# Patient Record
Sex: Female | Born: 1950 | Race: White | Hispanic: No | Marital: Married | Smoking: Never smoker
Health system: Southern US, Community
[De-identification: ages and names within clinical notes are randomized; demographics above are authoritative.]

## PROBLEM LIST (undated history)

## (undated) HISTORY — PX: EXCISIONAL HEMORRHOIDECTOMY: SHX1541

---

## 1998-03-28 ENCOUNTER — Other Ambulatory Visit: Admission: RE | Admit: 1998-03-28 | Discharge: 1998-03-28 | Payer: Self-pay | Admitting: Obstetrics and Gynecology

## 1999-06-04 ENCOUNTER — Other Ambulatory Visit: Admission: RE | Admit: 1999-06-04 | Discharge: 1999-06-04 | Payer: Self-pay | Admitting: *Deleted

## 2000-06-29 ENCOUNTER — Other Ambulatory Visit: Admission: RE | Admit: 2000-06-29 | Discharge: 2000-06-29 | Payer: Self-pay | Admitting: Obstetrics and Gynecology

## 2001-06-30 ENCOUNTER — Other Ambulatory Visit: Admission: RE | Admit: 2001-06-30 | Discharge: 2001-06-30 | Payer: Self-pay | Admitting: Obstetrics and Gynecology

## 2001-09-19 ENCOUNTER — Ambulatory Visit (HOSPITAL_COMMUNITY): Admission: RE | Admit: 2001-09-19 | Discharge: 2001-09-19 | Payer: Self-pay | Admitting: *Deleted

## 2001-11-24 ENCOUNTER — Encounter: Admission: RE | Admit: 2001-11-24 | Discharge: 2001-11-24 | Payer: Self-pay | Admitting: Obstetrics and Gynecology

## 2001-11-24 ENCOUNTER — Encounter: Payer: Self-pay | Admitting: Obstetrics and Gynecology

## 2002-07-05 ENCOUNTER — Other Ambulatory Visit: Admission: RE | Admit: 2002-07-05 | Discharge: 2002-07-05 | Payer: Self-pay | Admitting: Obstetrics and Gynecology

## 2003-08-28 ENCOUNTER — Other Ambulatory Visit: Admission: RE | Admit: 2003-08-28 | Discharge: 2003-08-28 | Payer: Self-pay | Admitting: Obstetrics and Gynecology

## 2003-10-17 ENCOUNTER — Encounter: Admission: RE | Admit: 2003-10-17 | Discharge: 2003-10-17 | Payer: Self-pay | Admitting: Obstetrics and Gynecology

## 2004-09-11 ENCOUNTER — Other Ambulatory Visit: Admission: RE | Admit: 2004-09-11 | Discharge: 2004-09-11 | Payer: Self-pay | Admitting: Obstetrics and Gynecology

## 2005-08-06 ENCOUNTER — Encounter: Admission: RE | Admit: 2005-08-06 | Discharge: 2005-08-06 | Payer: Self-pay | Admitting: Obstetrics and Gynecology

## 2005-09-15 ENCOUNTER — Other Ambulatory Visit: Admission: RE | Admit: 2005-09-15 | Discharge: 2005-09-15 | Payer: Self-pay | Admitting: Obstetrics and Gynecology

## 2005-12-07 ENCOUNTER — Ambulatory Visit (HOSPITAL_COMMUNITY): Admission: RE | Admit: 2005-12-07 | Discharge: 2005-12-07 | Payer: Self-pay | Admitting: *Deleted

## 2005-12-07 ENCOUNTER — Encounter (INDEPENDENT_AMBULATORY_CARE_PROVIDER_SITE_OTHER): Payer: Self-pay | Admitting: Specialist

## 2006-10-06 ENCOUNTER — Other Ambulatory Visit: Admission: RE | Admit: 2006-10-06 | Discharge: 2006-10-06 | Payer: Self-pay | Admitting: Obstetrics and Gynecology

## 2007-06-08 ENCOUNTER — Encounter: Admission: RE | Admit: 2007-06-08 | Discharge: 2007-06-08 | Payer: Self-pay | Admitting: Obstetrics and Gynecology

## 2007-10-12 ENCOUNTER — Other Ambulatory Visit: Admission: RE | Admit: 2007-10-12 | Discharge: 2007-10-12 | Payer: Self-pay | Admitting: Obstetrics and Gynecology

## 2008-09-05 ENCOUNTER — Encounter: Admission: RE | Admit: 2008-09-05 | Discharge: 2008-09-05 | Payer: Self-pay | Admitting: Obstetrics and Gynecology

## 2008-10-17 ENCOUNTER — Other Ambulatory Visit: Admission: RE | Admit: 2008-10-17 | Discharge: 2008-10-17 | Payer: Self-pay | Admitting: Obstetrics and Gynecology

## 2009-10-15 ENCOUNTER — Encounter: Admission: RE | Admit: 2009-10-15 | Discharge: 2009-10-15 | Payer: Self-pay | Admitting: Obstetrics and Gynecology

## 2009-10-30 ENCOUNTER — Other Ambulatory Visit: Admission: RE | Admit: 2009-10-30 | Discharge: 2009-10-30 | Payer: Self-pay | Admitting: Obstetrics and Gynecology

## 2010-06-13 NOTE — Op Note (Signed)
Charlene Jones, Charlene Jones              ACCOUNT NO.:  [id number]   MEDICAL RECORD NO.:  [id number]          PATIENT TYPE:  AMB   LOCATION:  DAY                          FACILITY:  [facility]   PHYSICIAN:  Alfonse Ras, MD   DATE OF BIRTH:  Oct 20, 1950   DATE OF PROCEDURE:  12/07/2005  DATE OF DISCHARGE:                               OPERATIVE REPORT   PREOPERATIVE DIAGNOSIS:  Internal hemorrhoids, external hemorrhoids,  with bleeding.   POSTOPERATIVE DIAGNOSIS:  Internal hemorrhoids, external hemorrhoids,  with bleeding.   PROCEDURE:  Procedure for prolapsing hemorrhoids, rectopexy.   ANESTHESIA:  General.   DESCRIPTION:  The patient was taken to the operating room and placed in  supine position.  After adequate anesthesia was induced using  endotracheal tube, the patient was placed in a prone jack-knife  position.  Perianal and rectal prep was performed using Betadine and  saline.  Anal dilatation was accomplished to three fingerbreadths.  The  three hemorrhoidal bundles were injected using 0.5% Marcaine with  Wydase.  Internal and external sphincter muscles were injected with an  additional 30 mL of 0.5% Marcaine.  A 2-0 Prolene pursestring suture was  placed in the submucosa approximately 4.5-5 cm proximal to the dentate  line.  This was done circumferentially.  The stapler was then introduced  into the rectum and placed above the pursestring suture.  This was tied  down.  It was then closed and fired.  A nice round rim of hemorrhoidal  tissue was seen.  Of note, the vagina was checked and there was no  involvement of the rectovaginal septum.  The staple line was then  inspected.  There was no bleeding.  Gelfoam packing was placed.  The  patient tolerated the procedure well and went to PACU in good condition.      Alfonse Ras, MD  Electronically Signed     KRE/MEDQ  D:  12/07/2005  T:  12/07/2005  Job:  [id number]

## 2010-10-22 ENCOUNTER — Other Ambulatory Visit: Payer: Self-pay | Admitting: Family Medicine

## 2010-10-22 DIAGNOSIS — Z1231 Encounter for screening mammogram for malignant neoplasm of breast: Secondary | ICD-10-CM

## 2010-10-28 ENCOUNTER — Ambulatory Visit
Admission: RE | Admit: 2010-10-28 | Discharge: 2010-10-28 | Disposition: A | Discharge status: Home / Self Care | Payer: Commercial Indemnity | Source: Ambulatory Visit | Attending: Family Medicine | Admitting: Family Medicine

## 2010-10-28 DIAGNOSIS — Z1231 Encounter for screening mammogram for malignant neoplasm of breast: Secondary | ICD-10-CM

## 2010-10-28 IMAGING — MG MM DIGITAL SCREENING {BCG}
4 series · 4 of 4 positions shown · non-contrast
Comparison: none

DG SCREEN MAMMOGRAM BILATERAL
Bilateral CC and MLO view(s) were taken.
Technologist: ABDALLA AHMED, RT, RM

DIGITAL SCREENING MAMMOGRAM WITH CAD:
There are scattered fibroglandular densities.  Possible asymmetry is noted in the left breast.  
Spot compression views and possibly sonography are recommended for further evaluation.  In the 
right breast, no masses or malignant type calcifications are identified.  Compared with prior 
studies.
Images were processed with CAD.

[R CC]
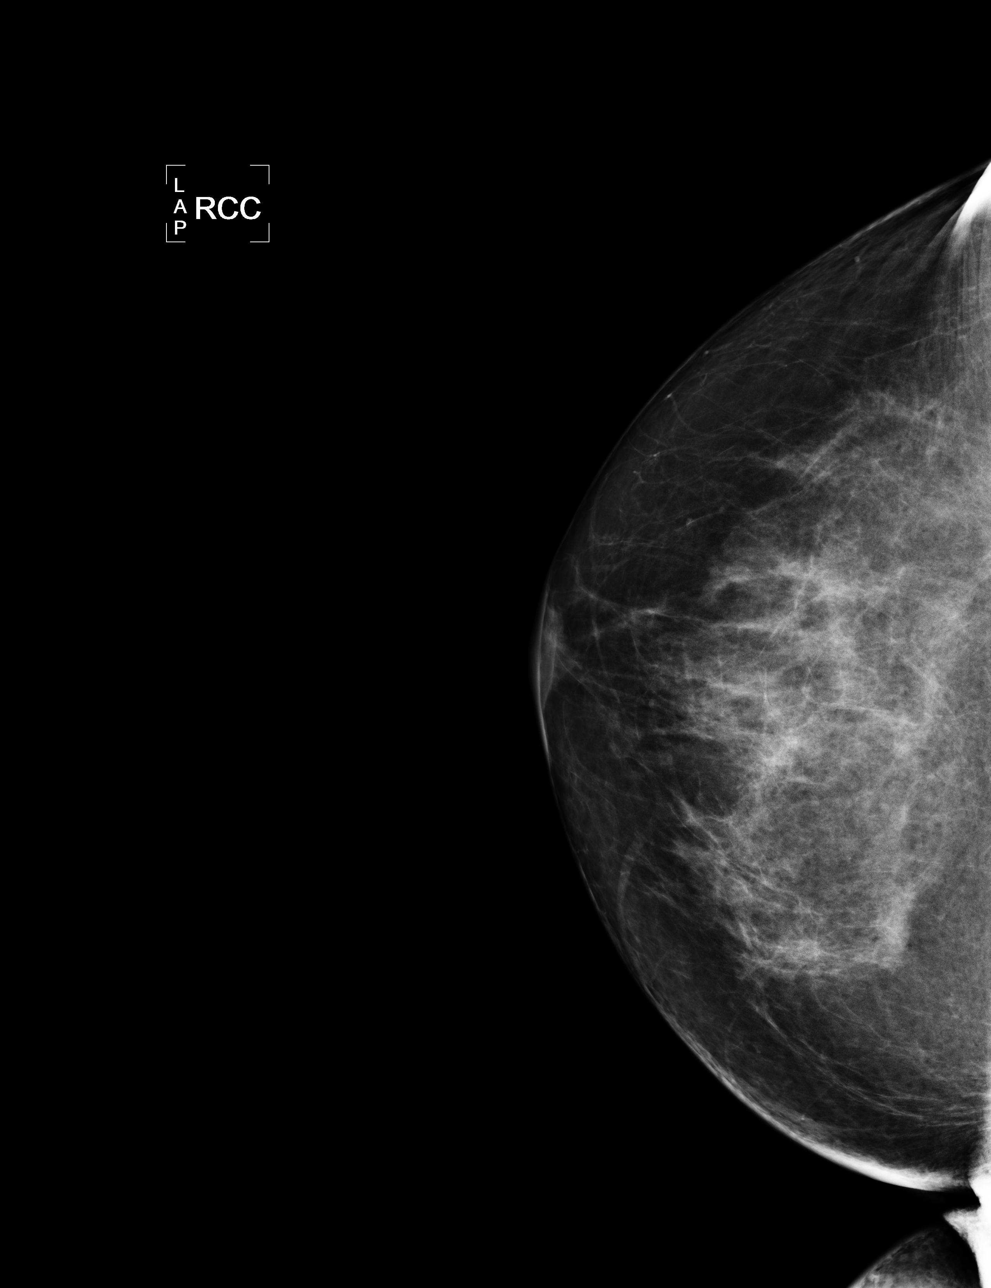

[L CC]
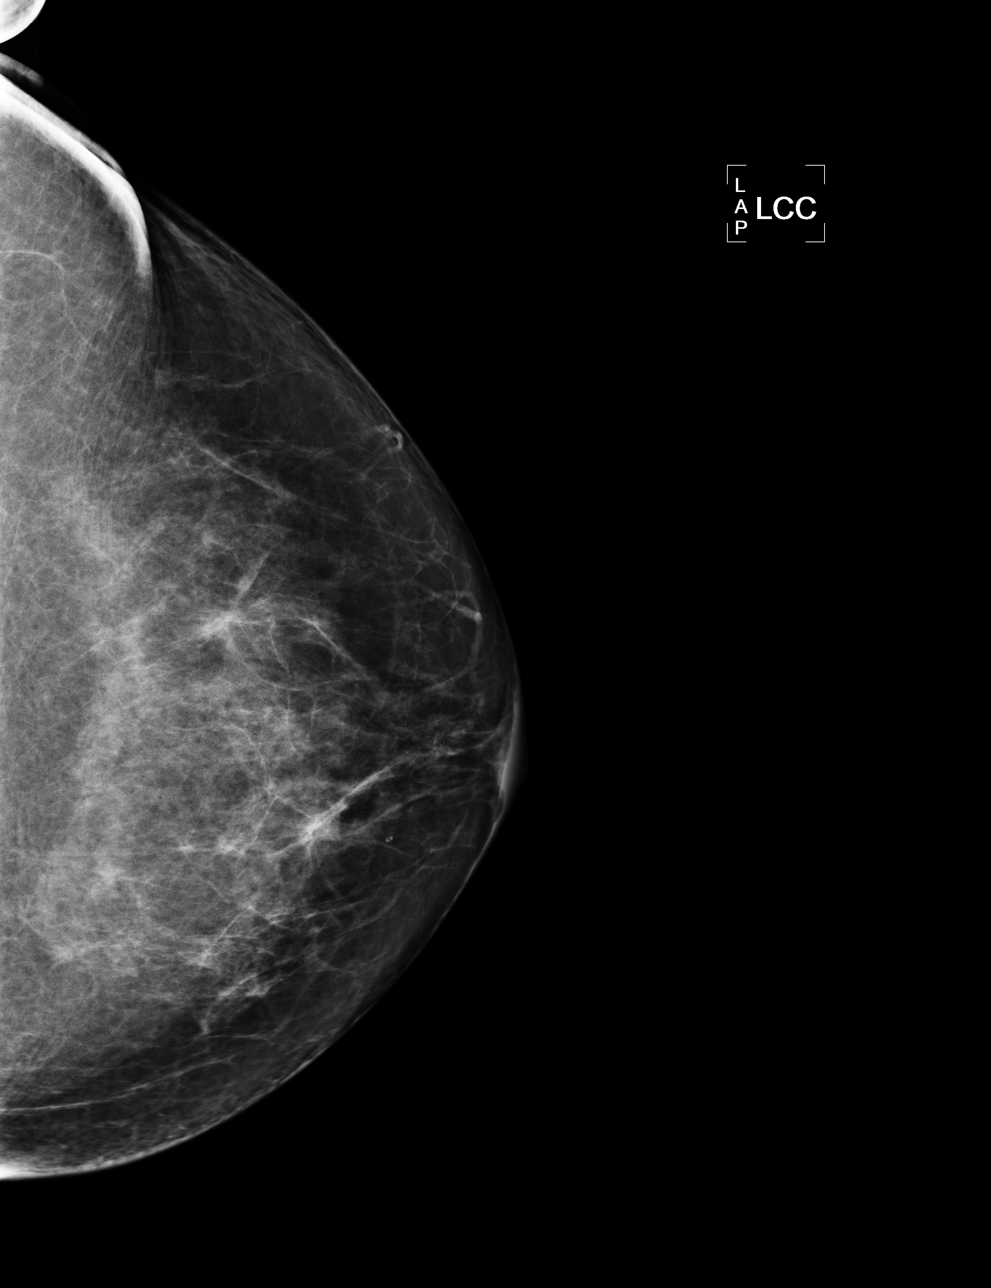

[L MLO]
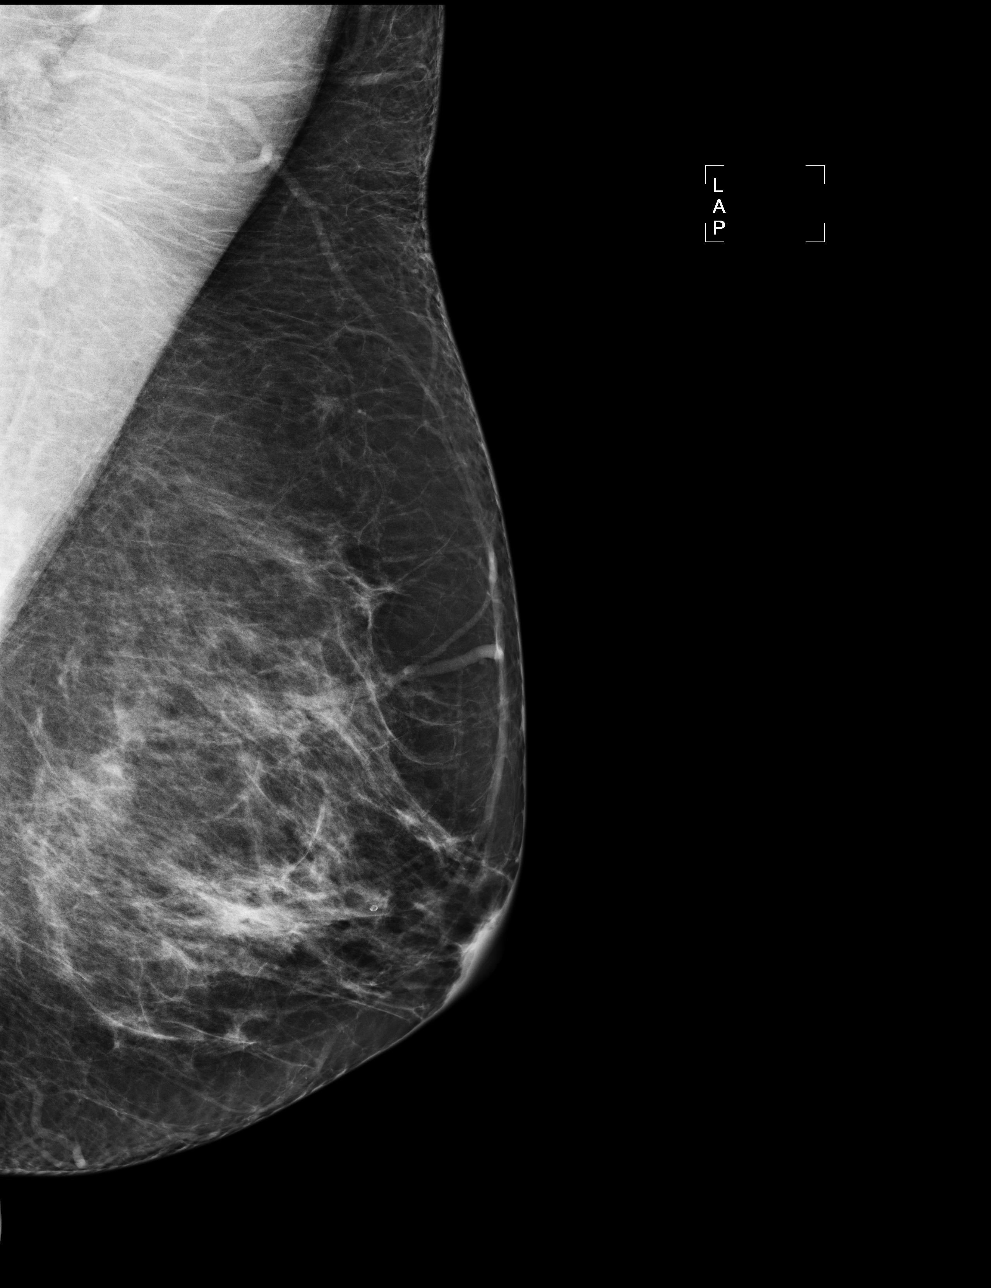

[R MLO]
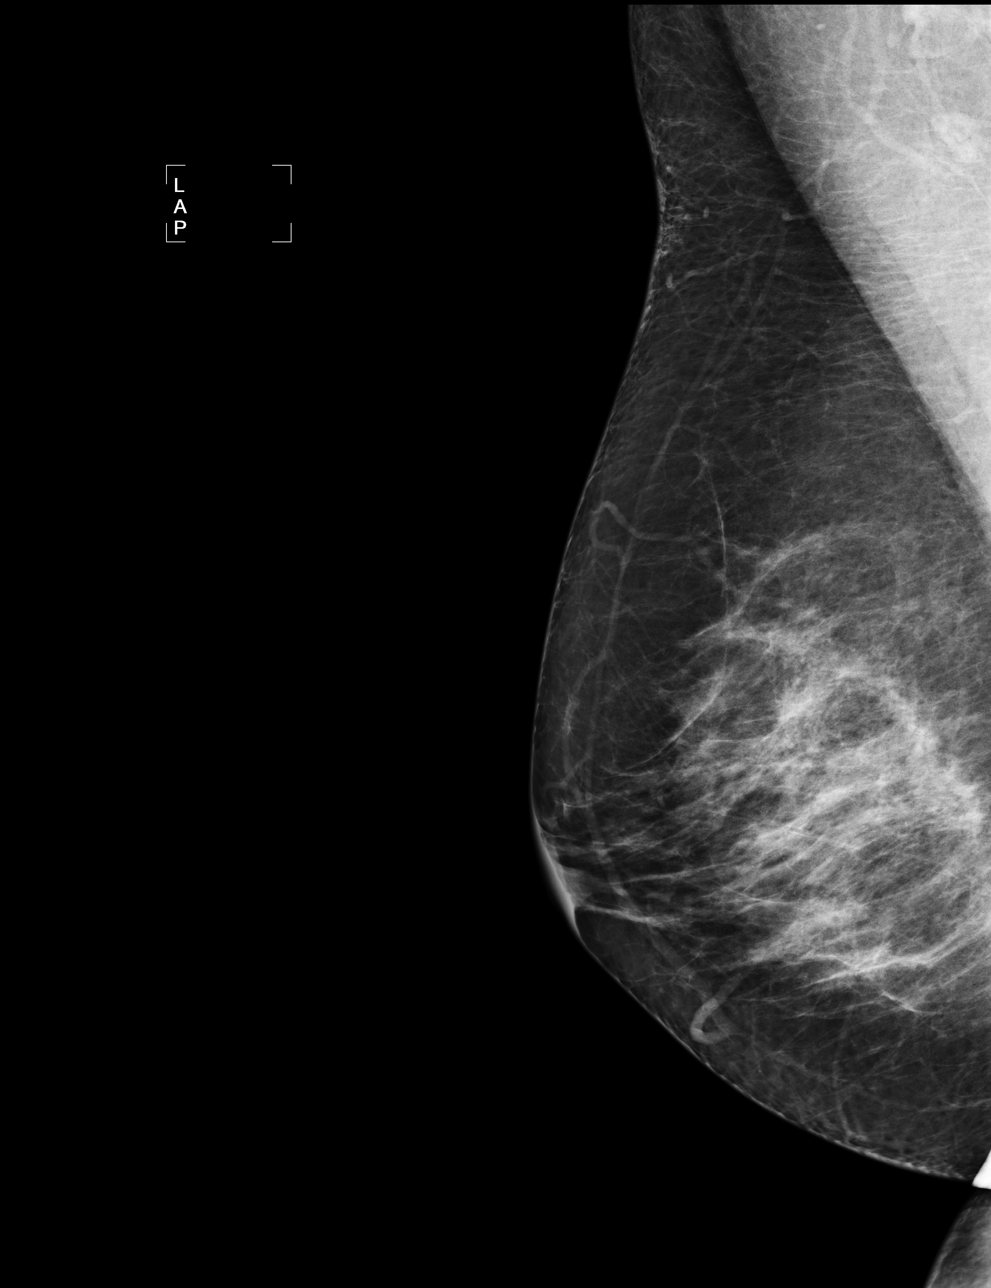

[4 of 4 positions shown; findings below may reference images not displayed]

IMPRESSION: Possible asymmetry, left breast.  Additional evaluation is indicated.  The patient will be 
contacted for additional studies and a supplementary report will follow.  No specific mammographic 
evidence of malignancy, right breast.

ASSESSMENT: Need additional imaging evaluation and/or prior mammograms for comparison - BI-RADS 0

Further imaging of the left breast.
,

## 2010-11-03 ENCOUNTER — Other Ambulatory Visit: Payer: Self-pay | Admitting: Family Medicine

## 2010-11-03 DIAGNOSIS — R928 Other abnormal and inconclusive findings on diagnostic imaging of breast: Secondary | ICD-10-CM

## 2010-11-14 ENCOUNTER — Ambulatory Visit
Admission: RE | Admit: 2010-11-14 | Discharge: 2010-11-14 | Disposition: A | Discharge status: Home / Self Care | Payer: Managed Care, Other (non HMO) | Source: Ambulatory Visit | Attending: Family Medicine | Admitting: Family Medicine

## 2010-11-14 DIAGNOSIS — R928 Other abnormal and inconclusive findings on diagnostic imaging of breast: Secondary | ICD-10-CM

## 2011-06-11 ENCOUNTER — Other Ambulatory Visit: Payer: Self-pay | Admitting: Family Medicine

## 2011-06-11 DIAGNOSIS — N6489 Other specified disorders of breast: Secondary | ICD-10-CM

## 2011-06-23 ENCOUNTER — Ambulatory Visit
Admission: RE | Admit: 2011-06-23 | Discharge: 2011-06-23 | Disposition: A | Discharge status: Home / Self Care | Payer: BC Managed Care – PPO | Source: Ambulatory Visit | Attending: Family Medicine | Admitting: Family Medicine

## 2011-06-23 DIAGNOSIS — N6489 Other specified disorders of breast: Secondary | ICD-10-CM

## 2011-12-15 ENCOUNTER — Other Ambulatory Visit: Payer: Self-pay | Admitting: Family Medicine

## 2011-12-15 DIAGNOSIS — Z1231 Encounter for screening mammogram for malignant neoplasm of breast: Secondary | ICD-10-CM

## 2012-01-21 ENCOUNTER — Ambulatory Visit
Admission: RE | Admit: 2012-01-21 | Discharge: 2012-01-21 | Disposition: A | Discharge status: Home / Self Care | Payer: BC Managed Care – PPO | Source: Ambulatory Visit | Attending: Family Medicine | Admitting: Family Medicine

## 2012-01-21 DIAGNOSIS — Z1231 Encounter for screening mammogram for malignant neoplasm of breast: Secondary | ICD-10-CM

## 2012-01-27 HISTORY — PX: OTHER SURGICAL HISTORY: SHX169

## 2012-11-30 ENCOUNTER — Other Ambulatory Visit: Payer: Self-pay | Admitting: Orthopedic Surgery

## 2012-11-30 DIAGNOSIS — M259 Joint disorder, unspecified: Secondary | ICD-10-CM

## 2012-12-02 ENCOUNTER — Other Ambulatory Visit: Payer: Self-pay

## 2012-12-08 ENCOUNTER — Other Ambulatory Visit: Payer: Self-pay | Admitting: Orthopedic Surgery

## 2012-12-10 ENCOUNTER — Ambulatory Visit
Admission: RE | Admit: 2012-12-10 | Discharge: 2012-12-10 | Disposition: A | Discharge status: Home / Self Care | Payer: BC Managed Care – PPO | Source: Ambulatory Visit | Attending: Orthopedic Surgery | Admitting: Orthopedic Surgery

## 2012-12-10 ENCOUNTER — Other Ambulatory Visit: Payer: Self-pay | Admitting: Orthopedic Surgery

## 2012-12-10 DIAGNOSIS — M259 Joint disorder, unspecified: Secondary | ICD-10-CM

## 2012-12-29 ENCOUNTER — Other Ambulatory Visit: Payer: Self-pay

## 2012-12-29 DIAGNOSIS — Z1231 Encounter for screening mammogram for malignant neoplasm of breast: Secondary | ICD-10-CM

## 2013-01-25 ENCOUNTER — Ambulatory Visit
Admission: RE | Admit: 2013-01-25 | Discharge: 2013-01-25 | Disposition: A | Discharge status: Home / Self Care | Payer: BC Managed Care – PPO | Source: Ambulatory Visit

## 2013-01-25 DIAGNOSIS — Z1231 Encounter for screening mammogram for malignant neoplasm of breast: Secondary | ICD-10-CM

## 2013-01-27 ENCOUNTER — Ambulatory Visit: Payer: BC Managed Care – PPO

## 2014-01-24 ENCOUNTER — Other Ambulatory Visit: Payer: Self-pay

## 2014-05-31 ENCOUNTER — Other Ambulatory Visit: Payer: Self-pay

## 2014-05-31 DIAGNOSIS — Z1231 Encounter for screening mammogram for malignant neoplasm of breast: Secondary | ICD-10-CM

## 2014-06-14 ENCOUNTER — Ambulatory Visit
Admission: RE | Admit: 2014-06-14 | Discharge: 2014-06-14 | Disposition: A | Discharge status: Home / Self Care | Payer: BLUE CROSS/BLUE SHIELD | Source: Ambulatory Visit

## 2014-06-14 DIAGNOSIS — Z1231 Encounter for screening mammogram for malignant neoplasm of breast: Secondary | ICD-10-CM

## 2015-06-19 ENCOUNTER — Other Ambulatory Visit: Payer: Self-pay

## 2015-06-19 DIAGNOSIS — Z1231 Encounter for screening mammogram for malignant neoplasm of breast: Secondary | ICD-10-CM

## 2015-07-11 ENCOUNTER — Ambulatory Visit
Admission: RE | Admit: 2015-07-11 | Discharge: 2015-07-11 | Disposition: A | Discharge status: Home / Self Care | Payer: BLUE CROSS/BLUE SHIELD | Source: Ambulatory Visit

## 2015-07-11 DIAGNOSIS — Z1231 Encounter for screening mammogram for malignant neoplasm of breast: Secondary | ICD-10-CM

## 2015-12-04 DIAGNOSIS — E782 Mixed hyperlipidemia: Secondary | ICD-10-CM

## 2015-12-04 DIAGNOSIS — R5383 Other fatigue: Secondary | ICD-10-CM | POA: Insufficient documentation

## 2015-12-04 DIAGNOSIS — R5381 Other malaise: Secondary | ICD-10-CM

## 2015-12-04 HISTORY — DX: Other malaise: R53.81

## 2015-12-04 HISTORY — DX: Other fatigue: R53.83

## 2015-12-04 HISTORY — DX: Mixed hyperlipidemia: E78.2

## 2016-10-02 ENCOUNTER — Other Ambulatory Visit: Payer: Self-pay | Admitting: Obstetrics and Gynecology

## 2016-10-02 DIAGNOSIS — Z1239 Encounter for other screening for malignant neoplasm of breast: Secondary | ICD-10-CM

## 2016-10-12 ENCOUNTER — Ambulatory Visit: Payer: BLUE CROSS/BLUE SHIELD

## 2016-10-14 ENCOUNTER — Ambulatory Visit
Admission: RE | Admit: 2016-10-14 | Discharge: 2016-10-14 | Disposition: A | Discharge status: Home / Self Care | Payer: BLUE CROSS/BLUE SHIELD | Source: Ambulatory Visit | Attending: Obstetrics and Gynecology | Admitting: Obstetrics and Gynecology

## 2016-10-14 DIAGNOSIS — Z1239 Encounter for other screening for malignant neoplasm of breast: Secondary | ICD-10-CM

## 2017-10-26 ENCOUNTER — Other Ambulatory Visit: Payer: Self-pay | Admitting: Obstetrics and Gynecology

## 2017-10-26 DIAGNOSIS — Z1231 Encounter for screening mammogram for malignant neoplasm of breast: Secondary | ICD-10-CM

## 2017-11-25 ENCOUNTER — Ambulatory Visit
Admission: RE | Admit: 2017-11-25 | Discharge: 2017-11-25 | Disposition: A | Discharge status: Home / Self Care | Payer: Medicare Other | Source: Ambulatory Visit | Attending: Obstetrics and Gynecology | Admitting: Obstetrics and Gynecology

## 2017-11-25 DIAGNOSIS — Z1231 Encounter for screening mammogram for malignant neoplasm of breast: Secondary | ICD-10-CM

## 2017-12-31 DIAGNOSIS — M8580 Other specified disorders of bone density and structure, unspecified site: Secondary | ICD-10-CM

## 2017-12-31 DIAGNOSIS — I10 Essential (primary) hypertension: Secondary | ICD-10-CM

## 2017-12-31 HISTORY — DX: Essential (primary) hypertension: I10

## 2017-12-31 HISTORY — DX: Other specified disorders of bone density and structure, unspecified site: M85.80

## 2018-12-28 ENCOUNTER — Other Ambulatory Visit: Payer: Self-pay | Admitting: Obstetrics and Gynecology

## 2018-12-28 DIAGNOSIS — Z1231 Encounter for screening mammogram for malignant neoplasm of breast: Secondary | ICD-10-CM

## 2019-01-06 ENCOUNTER — Ambulatory Visit: Payer: BLUE CROSS/BLUE SHIELD

## 2019-02-22 ENCOUNTER — Ambulatory Visit
Admission: RE | Admit: 2019-02-22 | Discharge: 2019-02-22 | Disposition: A | Discharge status: Home / Self Care | Payer: Medicare Other | Source: Ambulatory Visit | Attending: Obstetrics and Gynecology | Admitting: Obstetrics and Gynecology

## 2019-02-22 ENCOUNTER — Other Ambulatory Visit: Payer: Self-pay

## 2019-02-22 DIAGNOSIS — Z1231 Encounter for screening mammogram for malignant neoplasm of breast: Secondary | ICD-10-CM

## 2019-10-24 DIAGNOSIS — R200 Anesthesia of skin: Secondary | ICD-10-CM

## 2019-10-24 DIAGNOSIS — R208 Other disturbances of skin sensation: Secondary | ICD-10-CM | POA: Insufficient documentation

## 2019-10-24 HISTORY — DX: Other disturbances of skin sensation: R20.8

## 2019-10-24 HISTORY — DX: Anesthesia of skin: R20.0

## 2019-11-29 HISTORY — PX: HAND SURGERY: SHX662

## 2020-03-13 DIAGNOSIS — B370 Candidal stomatitis: Secondary | ICD-10-CM

## 2020-03-13 DIAGNOSIS — Z8616 Personal history of COVID-19: Secondary | ICD-10-CM

## 2020-03-13 HISTORY — DX: Personal history of COVID-19: Z86.16

## 2020-03-13 HISTORY — DX: Candidal stomatitis: B37.0

## 2020-06-14 ENCOUNTER — Other Ambulatory Visit: Payer: Self-pay | Admitting: Obstetrics and Gynecology

## 2020-06-14 DIAGNOSIS — Z1231 Encounter for screening mammogram for malignant neoplasm of breast: Secondary | ICD-10-CM

## 2020-06-24 DIAGNOSIS — R9431 Abnormal electrocardiogram [ECG] [EKG]: Secondary | ICD-10-CM | POA: Insufficient documentation

## 2020-06-24 HISTORY — DX: Abnormal electrocardiogram (ECG) (EKG): R94.31

## 2020-06-28 ENCOUNTER — Other Ambulatory Visit: Payer: Self-pay

## 2020-07-02 ENCOUNTER — Ambulatory Visit (INDEPENDENT_AMBULATORY_CARE_PROVIDER_SITE_OTHER): Payer: Medicare Other | Admitting: Cardiology

## 2020-07-02 ENCOUNTER — Encounter: Payer: Self-pay | Admitting: Cardiology

## 2020-07-02 ENCOUNTER — Other Ambulatory Visit: Payer: Self-pay

## 2020-07-02 VITALS — BP 138/82 | HR 81 | Ht 65.0 in | Wt 145.0 lb

## 2020-07-02 DIAGNOSIS — I10 Essential (primary) hypertension: Secondary | ICD-10-CM | POA: Diagnosis not present

## 2020-07-02 DIAGNOSIS — E782 Mixed hyperlipidemia: Secondary | ICD-10-CM | POA: Insufficient documentation

## 2020-07-02 DIAGNOSIS — R0602 Shortness of breath: Secondary | ICD-10-CM | POA: Insufficient documentation

## 2020-07-02 DIAGNOSIS — R9431 Abnormal electrocardiogram [ECG] [EKG]: Secondary | ICD-10-CM

## 2020-07-02 DIAGNOSIS — R600 Localized edema: Secondary | ICD-10-CM

## 2020-07-02 DIAGNOSIS — R079 Chest pain, unspecified: Secondary | ICD-10-CM | POA: Insufficient documentation

## 2020-07-02 HISTORY — DX: Chest pain, unspecified: R07.9

## 2020-07-02 HISTORY — DX: Mixed hyperlipidemia: E78.2

## 2020-07-02 HISTORY — DX: Localized edema: R60.0

## 2020-07-02 HISTORY — DX: Shortness of breath: R06.02

## 2020-07-02 NOTE — Patient Instructions (Signed)
Medication Instructions:  Your physician recommends that you continue on your current medications as directed. Please refer to the Current Medication list given to you today.  *If you need a refill on your cardiac medications before your next appointment, please call your pharmacy*   Lab Work: None If you have labs (blood work) drawn today and your tests are completely normal, you will receive your results only by: Marland Kitchen MyChart Message (if you have MyChart) OR . A paper copy in the mail If you have any lab test that is abnormal or we need to change your treatment, we will call you to review the results.   Testing/Procedures: Your physician has requested that you have an echocardiogram. Echocardiography is a painless test that uses sound waves to create images of your heart. It provides your doctor with information about the size and shape of your heart and how well your heart's chambers and valves are working. This procedure takes approximately one hour. There are no restrictions for this procedure.    [facility] [address] [place], [place] [address] Phone:  [phone]    Please arrive 15 minutes prior to your appointment time for registration and insurance purposes.  The test will take approximately 3 to 4 hours to complete; you may bring reading material.  If someone comes with you to your appointment, they will need to remain in the main lobby due to limited space in the testing area. **If you are pregnant or breastfeeding, please notify the nuclear lab prior to your appointment**  How to prepare for your Myocardial Perfusion Test: . Do not eat or drink 3 hours prior to your test, except you may have water. . Do not consume products containing caffeine (regular or decaffeinated) 12 hours prior to your test. (ex: coffee, chocolate, sodas, tea). . Do bring a list of your current medications with you.  If not listed below, you may take your medications  as normal. . Do wear comfortable clothes (no dresses or overalls) and walking shoes, tennis shoes preferred (No heels or open toe shoes are allowed). . Do NOT wear cologne, perfume, aftershave, or lotions (deodorant is allowed). . If these instructions are not followed, your test will have to be rescheduled.  Please report to [address] for your test.  If you have questions or concerns about your appointment, you can call the [facility] at [phone].  If you cannot keep your appointment, please provide 24 hours notification to the Nuclear Lab, to avoid a possible $50 charge to your account.    Follow-Up: At [facility], you and your health needs are our priority.  As part of our continuing mission to provide you with exceptional heart care, we have created designated Provider Care Teams.  These Care Teams include your primary Cardiologist (physician) and Advanced Practice Providers (APPs -  Physician Assistants and Nurse Practitioners) who all work together to provide you with the care you need, when you need it.  We recommend signing up for the patient portal called "MyChart".  Sign up information is provided on this After Visit Summary.  MyChart is used to connect with patients for Virtual Visits (Telemedicine).  Patients are able to view lab/test results, encounter notes, upcoming appointments, etc.  Non-urgent messages can be sent to your provider as well.   To learn more about what you can do with MyChart, go to [email or web address].    Your next appointment:   6 week(s)  The format for your next appointment:   In Person  Provider:   Berniece Salines, DO   Other Instructions

## 2020-07-02 NOTE — Progress Notes (Signed)
Cardiology Office Note:    Date:  07/02/2020   ID:  Charlene BaneDeborah P Pistole, DOB March 27, 1950, MRN [id number]  PCP:  Krystal ClarkBrown-Patram, Melissa Joyce, NP  Cardiologist:  Thomasene RippleKardie Judieth Mckown, DO  Electrophysiologist:  None   Referring MD: Rhea BleacherBrown-Patram, Melissa J*   I have been having some chest pain  History of Present Illness:    Charlene Jones is a 70 y.o. female with a hx of hyperlipidemia, elevated blood pressure here today to be evaluated for intermittent chest pain.  Patient tells me about 3 weeks ago she started experiencing midsternal chest discomfort.  Described as a dull sensation.  Symptoms are last for less than 5 minutes sometimes he goes up to a little more than 10 minutes.  Recently given this her PCP has started her on aspirin 81 mg daily.  She is concerned as the frequency is progressing and her chest pain is not resolving.  She does admit to associated shortness of breath.  Sometimes she says that shortness of breath is isolated.  No other complaints at this time.  Past Medical History:  Diagnosis Date  . Abnormal EKG 06/24/2020  . Essential hypertension 12/31/2017  . Hyperlipemia, mixed 12/04/2015  . Malaise and fatigue 12/04/2015  . Numbness of left foot 10/24/2019  . Oral thrush 03/13/2020  . Osteopenia 12/31/2017   Formatting of this note might be different from the original. FRAX 14% and 1.2%  11/2017  . Personal history of COVID-19 03/13/2020    Past Surgical History:  Procedure Laterality Date  . WRIST SURGREY  2014  . wrist surgrey  11/2019    Current Medications: Current Meds  Medication Sig  . Cholecalciferol 25 MCG (1000 UT) tablet Take 1,000 Units by mouth daily at 6 (six) AM.  . Multiple Vitamins-Minerals (MULTIVITAMIN WITH MINERALS) tablet Take 1 tablet by mouth daily.  . Omega-3 1000 MG CAPS Take 1 g by mouth daily at 6 (six) AM.  . Red Yeast Rice Extract (RED YEAST RICE PO) Take 1 tablet by mouth daily at 6 (six) AM.     Allergies:   Cephalexin and Codeine   Social  History   Socioeconomic History  . Marital status: Married    Spouse name: Not on file  . Number of children: Not on file  . Years of education: Not on file  . Highest education level: Not on file  Occupational History  . Not on file  Tobacco Use  . Smoking status: Never Smoker  . Smokeless tobacco: Never Used  Substance and Sexual Activity  . Alcohol use: Not Currently  . Drug use: Never  . Sexual activity: Not on file  Other Topics Concern  . Not on file  Social History Narrative  . Not on file   Social Determinants of Health   Financial Resource Strain: Not on file  Food Insecurity: Not on file  Transportation Needs: Not on file  Physical Activity: Not on file  Stress: Not on file  Social Connections: Not on file     Family History: The patient's family history includes Alzheimer's disease in her mother; Breast cancer in her paternal aunt; Colon cancer in her maternal grandmother; Dementia in her paternal grandmother; Heart attack (age of onset: 6156) in her father; Stomach cancer in her paternal grandfather; Stroke in her maternal grandfather.  ROS:   Review of Systems  Constitution: Negative for decreased appetite, fever and weight gain.  HENT: Negative for congestion, ear discharge, hoarse voice and sore throat.   Eyes: Negative  for discharge, redness, vision loss in right eye and visual halos.  Cardiovascular: Reports chest pain, dyspnea on exertion.  Negative for leg swelling, orthopnea and palpitations.  Respiratory: Negative for cough, hemoptysis, shortness of breath and snoring.   Endocrine: Negative for heat intolerance and polyphagia.  Hematologic/Lymphatic: Negative for bleeding problem. Does not bruise/bleed easily.  Skin: Negative for flushing, nail changes, rash and suspicious lesions.  Musculoskeletal: Negative for arthritis, joint pain, muscle cramps, myalgias, neck pain and stiffness.  Gastrointestinal: Negative for abdominal pain, bowel incontinence,  diarrhea and excessive appetite.  Genitourinary: Negative for decreased libido, genital sores and incomplete emptying.  Neurological: Negative for brief paralysis, focal weakness, headaches and loss of balance.  Psychiatric/Behavioral: Negative for altered mental status, depression and suicidal ideas.  Allergic/Immunologic: Negative for HIV exposure and persistent infections.    EKGs/Labs/Other Studies Reviewed:    The following studies were reviewed today:   EKG:  The ekg ordered today demonstrates sinus rhythm, heart rate 81 bpm with occasional premature atrial complexes.  P wave morphology suggesting left atrial enlargement and poor progression in the precordial leads suggesting possible old anterior wall infarction of age-indeterminate.  Recent Labs: No results found for requested labs within last 8760 hours.  Recent Lipid Panel No results found for: CHOL, TRIG, HDL, CHOLHDL, VLDL, LDLCALC, LDLDIRECT  Physical Exam:    VS:  BP 138/82 (BP Location: Right Arm)   Pulse 81   Ht 5\' 5"  (1.651 m)   Wt 145 lb (65.8 kg)   SpO2 98%   BMI 24.13 kg/m     Wt Readings from Last 3 Encounters:  07/02/20 145 lb (65.8 kg)     GEN: Well nourished, well developed in no acute distress HEENT: Normal NECK: No JVD; No carotid bruits LYMPHATICS: No lymphadenopathy CARDIAC: S1S2 noted,RRR, no murmurs, rubs, gallops RESPIRATORY:  Clear to auscultation without rales, wheezing or rhonchi  ABDOMEN: Soft, non-tender, non-distended, +bowel sounds, no guarding. EXTREMITIES: +2 bilateral leg edema edema, No cyanosis, no clubbing MUSCULOSKELETAL:  No deformity  SKIN: Warm and dry NEUROLOGIC:  Alert and oriented x 3, non-focal PSYCHIATRIC:  Normal affect, good insight  ASSESSMENT:    1. Essential hypertension   2. Shortness of breath   3. Chest pain of uncertain etiology   4. Bilateral leg edema   5. Mixed hyperlipidemia   6. Abnormal EKG    PLAN:    Her chest pain is concerning given her  risk factors we need to proceed with testing in this patient for ischemic evaluation.  She is agreeable to understand echo and exercise nuclear stress test.  Have educated patient about this testing.  In the meantime she has some shortness of breath and on physical exam there is evidence of bilateral leg edema.  Ideally we should really start her on low-dose diuretics to see if this is going to help with the patient really has declined any use of medication.  So for now I will get an echocardiogram to assess her LV function.  She is going to take her blood pressure at home and follow this she will come back in 4 weeks for nurse visit to make sure that her blood pressure is not elevated and her leg edema is not worsening.  Her most recent cholesterol profile showed LDL 185, total cholesterol 258, triglyceride 84 and HDL 65 for now she would like to hold off on any medication.   The patient is in agreement with the above plan. The patient left the office in  stable condition.  The patient will follow up in 3 months or sooner if needed.   Medication Adjustments/Labs and Tests Ordered: Current medicines are reviewed at length with the patient today.  Concerns regarding medicines are outlined above.  Orders Placed This Encounter  Procedures  . MYOCARDIAL PERFUSION IMAGING  . EKG 12-Lead  . ECHOCARDIOGRAM COMPLETE   No orders of the defined types were placed in this encounter.   Patient Instructions  Medication Instructions:  Your physician recommends that you continue on your current medications as directed. Please refer to the Current Medication list given to you today.  *If you need a refill on your cardiac medications before your next appointment, please call your pharmacy*   Lab Work: None If you have labs (blood work) drawn today and your tests are completely normal, you will receive your results only by: Marland Kitchen MyChart Message (if you have MyChart) OR . A paper copy in the mail If you  have any lab test that is abnormal or we need to change your treatment, we will call you to review the results.   Testing/Procedures: Your physician has requested that you have an echocardiogram. Echocardiography is a painless test that uses sound waves to create images of your heart. It provides your doctor with information about the size and shape of your heart and how well your heart's chambers and valves are working. This procedure takes approximately one hour. There are no restrictions for this procedure.    [facility] [address] [place], [place] [address] Phone:  [phone]    Please arrive 15 minutes prior to your appointment time for registration and insurance purposes.  The test will take approximately 3 to 4 hours to complete; you may bring reading material.  If someone comes with you to your appointment, they will need to remain in the main lobby due to limited space in the testing area. **If you are pregnant or breastfeeding, please notify the nuclear lab prior to your appointment**  How to prepare for your Myocardial Perfusion Test: . Do not eat or drink 3 hours prior to your test, except you may have water. . Do not consume products containing caffeine (regular or decaffeinated) 12 hours prior to your test. (ex: coffee, chocolate, sodas, tea). . Do bring a list of your current medications with you.  If not listed below, you may take your medications as normal. . Do wear comfortable clothes (no dresses or overalls) and walking shoes, tennis shoes preferred (No heels or open toe shoes are allowed). . Do NOT wear cologne, perfume, aftershave, or lotions (deodorant is allowed). . If these instructions are not followed, your test will have to be rescheduled.  Please report to [address] for your test.  If you have questions or concerns about your appointment, you can call the [facility] at  [phone].  If you cannot keep your appointment, please provide 24 hours notification to the Nuclear Lab, to avoid a possible $50 charge to your account.    Follow-Up: At [facility], you and your health needs are our priority.  As part of our continuing mission to provide you with exceptional heart care, we have created designated Provider Care Teams.  These Care Teams include your primary Cardiologist (physician) and Advanced Practice Providers (APPs -  Physician Assistants and Nurse Practitioners) who all work together to provide you with the care you need, when you need it.  We recommend signing up  for the patient portal called "MyChart".  Sign up information is provided on this After Visit Summary.  MyChart is used to connect with patients for Virtual Visits (Telemedicine).  Patients are able to view lab/test results, encounter notes, upcoming appointments, etc.  Non-urgent messages can be sent to your provider as well.   To learn more about what you can do with MyChart, go to [email or web address].    Your next appointment:   6 week(s)  The format for your next appointment:   In Person  Provider:   Thomasene Ripple, DO   Other Instructions      Adopting a Healthy Lifestyle.  Know what a healthy weight is for you (roughly BMI <25) and aim to maintain this   Aim for 7+ servings of fruits and vegetables daily   65-80+ fluid ounces of water or unsweet tea for healthy kidneys   Limit to max 1 drink of alcohol per day; avoid smoking/tobacco   Limit animal fats in diet for cholesterol and heart health - choose grass fed whenever available   Avoid highly processed foods, and foods high in saturated/trans fats   Aim for low stress - take time to unwind and care for your mental health   Aim for 150 min of moderate intensity exercise weekly for heart health, and weights twice weekly for bone health   Aim for 7-9 hours of sleep daily   When it comes to diets, agreement  about the perfect plan isnt easy to find, even among the experts. Experts at the [facility] [employer] developed an idea known as the Healthy Eating Plate. Just imagine a plate divided into logical, healthy portions.   The emphasis is on diet quality:   Load up on vegetables and fruits - one-half of your plate: Aim for color and variety, and remember that potatoes dont count.   Go for whole grains - one-quarter of your plate: Whole wheat, barley, wheat berries, quinoa, oats, brown rice, and foods made with them. If you want pasta, go with whole wheat pasta.   Protein power - one-quarter of your plate: Fish, chicken, beans, and nuts are all healthy, versatile protein sources. Limit red meat.   The diet, however, does go beyond the plate, offering a few other suggestions.   Use healthy plant oils, such as olive, canola, soy, corn, sunflower and peanut. Check the labels, and avoid partially hydrogenated oil, which have unhealthy trans fats.   If youre thirsty, drink water. Coffee and tea are good in moderation, but skip sugary drinks and limit milk and dairy products to one or two daily servings.   The type of carbohydrate in the diet is more important than the amount. Some sources of carbohydrates, such as vegetables, fruits, whole grains, and beans-are healthier than others.   Finally, stay active  Signed, Thomasene Ripple, DO  07/02/2020 5:25 PM    [facility]

## 2020-07-08 ENCOUNTER — Encounter: Payer: Self-pay | Admitting: Cardiology

## 2020-07-10 ENCOUNTER — Telehealth (HOSPITAL_COMMUNITY): Payer: Self-pay | Admitting: *Deleted

## 2020-07-10 NOTE — Telephone Encounter (Signed)
Left message on voicemail per DPR in reference to upcoming appointment scheduled on 07/17/20 at 8:15 with detailed instructions given per Myocardial Perfusion Study Information Sheet for the test. LM to arrive 15 minutes early, and that it is imperative to arrive on time for appointment to keep from having the test rescheduled. If you need to cancel or reschedule your appointment, please call the office within 24 hours of your appointment. Failure to do so may result in a cancellation of your appointment, and a $50 no show fee. Phone number given for call back for any questions.

## 2020-07-11 ENCOUNTER — Other Ambulatory Visit: Payer: Self-pay | Admitting: Cardiology

## 2020-07-11 ENCOUNTER — Other Ambulatory Visit (HOSPITAL_COMMUNITY): Payer: Self-pay | Admitting: Cardiology

## 2020-07-11 DIAGNOSIS — R0602 Shortness of breath: Secondary | ICD-10-CM

## 2020-07-11 DIAGNOSIS — R079 Chest pain, unspecified: Secondary | ICD-10-CM

## 2020-07-17 ENCOUNTER — Other Ambulatory Visit: Payer: Self-pay

## 2020-07-17 ENCOUNTER — Ambulatory Visit (INDEPENDENT_AMBULATORY_CARE_PROVIDER_SITE_OTHER): Payer: Medicare Other

## 2020-07-17 DIAGNOSIS — R079 Chest pain, unspecified: Secondary | ICD-10-CM

## 2020-07-17 DIAGNOSIS — R0602 Shortness of breath: Secondary | ICD-10-CM | POA: Diagnosis not present

## 2020-07-17 LAB — MYOCARDIAL PERFUSION IMAGING
Estimated workload: 10.1 METS
Exercise duration (min): 8 min
Exercise duration (sec): 16 s
LV dias vol: 66 mL (ref 46–106)
LV sys vol: 15 mL
MPHR: 153 {beats}/min
Peak HR: 142 {beats}/min
Percent HR: 92 %
Rest HR: 67 {beats}/min
SDS: 2
SRS: 0
SSS: 2
TID: 0.86

## 2020-07-17 MED ORDER — TECHNETIUM TC 99M TETROFOSMIN IV KIT
30.9000 | PACK | Freq: Once | INTRAVENOUS | Status: AC | PRN
  Administered 2020-07-17: 30.9 via INTRAVENOUS

## 2020-07-17 MED ORDER — TECHNETIUM TC 99M TETROFOSMIN IV KIT
10.4000 | PACK | Freq: Once | INTRAVENOUS | Status: AC | PRN
  Administered 2020-07-17: 10.4 via INTRAVENOUS

## 2020-07-23 ENCOUNTER — Other Ambulatory Visit: Payer: Self-pay

## 2020-07-23 ENCOUNTER — Encounter: Payer: Self-pay | Admitting: Cardiology

## 2020-07-23 ENCOUNTER — Ambulatory Visit (INDEPENDENT_AMBULATORY_CARE_PROVIDER_SITE_OTHER): Payer: Medicare Other | Admitting: Cardiology

## 2020-07-23 VITALS — BP 110/60 | HR 83 | Ht 65.0 in | Wt 143.8 lb

## 2020-07-23 DIAGNOSIS — R9439 Abnormal result of other cardiovascular function study: Secondary | ICD-10-CM

## 2020-07-23 DIAGNOSIS — I4729 Other ventricular tachycardia: Secondary | ICD-10-CM

## 2020-07-23 DIAGNOSIS — I472 Ventricular tachycardia: Secondary | ICD-10-CM | POA: Diagnosis not present

## 2020-07-23 DIAGNOSIS — I10 Essential (primary) hypertension: Secondary | ICD-10-CM | POA: Diagnosis not present

## 2020-07-23 DIAGNOSIS — E782 Mixed hyperlipidemia: Secondary | ICD-10-CM

## 2020-07-23 HISTORY — DX: Abnormal result of other cardiovascular function study: R94.39

## 2020-07-23 MED ORDER — ASPIRIN EC 81 MG PO TBEC: 81.0000 mg | DELAYED_RELEASE_TABLET | Freq: Every day | ORAL | 3 refills | Status: AC

## 2020-07-23 MED ORDER — ROSUVASTATIN CALCIUM 10 MG PO TABS: 10.0000 mg | ORAL_TABLET | Freq: Every day | ORAL | 3 refills | Status: DC

## 2020-07-23 NOTE — H&P (View-Only) (Signed)
Cardiology Office Note:    Date:  07/23/2020   ID:  Charlene Jones, DOB 08-Aug-1950, MRN [id number]  PCP:  Krystal Clark, NP  Cardiologist:  Thomasene Ripple, DO  Electrophysiologist:  None   Referring MD: Rhea Bleacher*   No chief complaint on file. " I am doing ok"  History of Present Illness:    Charlene Jones is a 70 y.o. female with a hx of hyperlipidemia, hypertension is here today for follow-up.  I saw the patient on July 02, 2020 at that time she was being evaluated for intermittent chest discomfort and shortness of breath.  At that time given her risk factors as well as her abnormal EKG I recommended the patient undergo a nuclear stress test.  She was able to get her exercise nuclear stress test and is here today to discuss result.  She also did have bilateral leg edema that day and an echocardiogram was ordered.  I recommended low-dose diuretics but the patient declined.  Past Medical History:  Diagnosis Date   Abnormal EKG 06/24/2020   Essential hypertension 12/31/2017   Hyperlipemia, mixed 12/04/2015   Malaise and fatigue 12/04/2015   Numbness of left foot 10/24/2019   Oral thrush 03/13/2020   Osteopenia 12/31/2017   Formatting of this note might be different from the original. FRAX 14% and 1.2%  11/2017   Personal history of COVID-19 03/13/2020    Past Surgical History:  Procedure Laterality Date   EXCISIONAL HEMORRHOIDECTOMY     with stapling   HAND SURGERY Left 11/29/2019   thumb, cmc joint resection orthroplasty, Dr. Loralie Champagne @ [facility]   WRIST SURGREY  2014    Current Medications: Current Meds  Medication Sig   aspirin EC 81 MG tablet Take 1 tablet (81 mg total) by mouth daily. Swallow whole.   Cholecalciferol 25 MCG (1000 UT) tablet Take 1,000 Units by mouth daily at 6 (six) AM.   Multiple Vitamins-Minerals (MULTIVITAMIN WITH MINERALS) tablet Take 1 tablet by mouth daily.   Omega-3 1000 MG CAPS Take 1 g by mouth daily at 6 (six) AM.   Red Yeast  Rice Extract (RED YEAST RICE PO) Take 1 tablet by mouth daily at 6 (six) AM.   rosuvastatin (CRESTOR) 10 MG tablet Take 1 tablet (10 mg total) by mouth daily.     Allergies:   Cephalexin and Codeine   Social History   Socioeconomic History   Marital status: Married    Spouse name: Not on file   Number of children: Not on file   Years of education: Not on file   Highest education level: Not on file  Occupational History   Not on file  Tobacco Use   Smoking status: Never   Smokeless tobacco: Never  Substance and Sexual Activity   Alcohol use: Not Currently   Drug use: Never   Sexual activity: Not on file  Other Topics Concern   Not on file  Social History Narrative   Not on file   Social Determinants of Health   Financial Resource Strain: Not on file  Food Insecurity: Not on file  Transportation Needs: Not on file  Physical Activity: Not on file  Stress: Not on file  Social Connections: Not on file     Family History: The patient's family history includes Alzheimer's disease in her mother; Breast cancer in her paternal aunt; CAD in her father; Chorea in her mother; Colon cancer in her maternal grandmother; Dementia in her paternal grandmother; Heart attack (  age of onset: 25) in her father; Stomach cancer in her paternal grandfather; Stroke in her maternal grandfather.  ROS:   Review of Systems  Constitution: Negative for decreased appetite, fever and weight gain.  HENT: Negative for congestion, ear discharge, hoarse voice and sore throat.   Eyes: Negative for discharge, redness, vision loss in right eye and visual halos.  Cardiovascular: Negative for chest pain, dyspnea on exertion, leg swelling, orthopnea and palpitations.  Respiratory: Negative for cough, hemoptysis, shortness of breath and snoring.   Endocrine: Negative for heat intolerance and polyphagia.  Hematologic/Lymphatic: Negative for bleeding problem. Does not bruise/bleed easily.  Skin: Negative for  flushing, nail changes, rash and suspicious lesions.  Musculoskeletal: Negative for arthritis, joint pain, muscle cramps, myalgias, neck pain and stiffness.  Gastrointestinal: Negative for abdominal pain, bowel incontinence, diarrhea and excessive appetite.  Genitourinary: Negative for decreased libido, genital sores and incomplete emptying.  Neurological: Negative for brief paralysis, focal weakness, headaches and loss of balance.  Psychiatric/Behavioral: Negative for altered mental status, depression and suicidal ideas.  Allergic/Immunologic: Negative for HIV exposure and persistent infections.    EKGs/Labs/Other Studies Reviewed:    The following studies were reviewed today:   EKG:  None today  Exercise  The left ventricular ejection fraction is hyperdynamic (>65%). Nuclear stress EF: 77%. There was no ST segment deviation noted during stress. No T wave inversion was noted during stress. 6 beat run of nonsustained ventricular tachycardia at peak exercise. Exercise duration 8 minutes 16 seconds, achieving 92% of maximal predicted heart rate, at 10.1 METS. Blood pressure response was normal with exercise. Defect 1: There is a small reversible defect of mild severity present in the basal inferior location. Findings consistent with ischemia. This is an intermediate risk study, due to ischemia as well as nonsustained ventricular tachycardia during peak exercise.    Recent Labs: No results found for requested labs within last 8760 hours.  Recent Lipid Panel No results found for: CHOL, TRIG, HDL, CHOLHDL, VLDL, LDLCALC, LDLDIRECT  Physical Exam:    VS:  BP 110/60   Pulse 83   Ht 5\' 5"  (1.651 m)   Wt 143 lb 12.8 oz (65.2 kg)   SpO2 97%   BMI 23.93 kg/m     Wt Readings from Last 3 Encounters:  07/23/20 143 lb 12.8 oz (65.2 kg)  07/17/20 145 lb (65.8 kg)  07/02/20 145 lb (65.8 kg)     GEN: Well nourished, well developed in no acute distress HEENT: Normal NECK: No JVD; No  carotid bruits LYMPHATICS: No lymphadenopathy CARDIAC: S1S2 noted,RRR, no murmurs, rubs, gallops RESPIRATORY:  Clear to auscultation without rales, wheezing or rhonchi  ABDOMEN: Soft, non-tender, non-distended, +bowel sounds, no guarding. EXTREMITIES: No edema, No cyanosis, no clubbing MUSCULOSKELETAL:  No deformity  SKIN: Warm and dry NEUROLOGIC:  Alert and oriented x 3, non-focal PSYCHIATRIC:  Normal affect, good insight  ASSESSMENT:    1. Abnormal nuclear stress test   2. Essential hypertension   3. Hyperlipemia, mixed   4. NSVT (nonsustained ventricular tachycardia) (HCC)    PLAN:    Her nuclear stress test is abnormal at this time I discussed with the patient given her risk factors to proceed with left heart catheterization.  The patient understands that risks include but are not limited to stroke (1 in 1000), death (1 in 1000), kidney failure [usually temporary] (1 in 500), bleeding (1 in 200), allergic reaction [possibly serious] (1 in 200), and agrees to proceed.  We will start the patient  on aspirin 81 mg daily as well as Crestor 5 mg daily.  She is very hesitant to start the statin medication.  But her last lipid profile showed LDL 185, total cholesterol 258, triglyceride 84 and HDL 65.  She would rather take red yeast rice.  But for now she is willing to try Crestor 5 mg daily for  Her blood pressure acceptable today.  The patient is in agreement with the above plan. The patient left the office in stable condition.  The patient will follow up in 4 weeks post heart catheterization.   Medication Adjustments/Labs and Tests Ordered: Current medicines are reviewed at length with the patient today.  Concerns regarding medicines are outlined above.  Orders Placed This Encounter  Procedures   Basic metabolic panel   Magnesium   CBC with Differential/Platelet    Meds ordered this encounter  Medications   rosuvastatin (CRESTOR) 10 MG tablet    Sig: Take 1 tablet (10 mg  total) by mouth daily.    Dispense:  90 tablet    Refill:  3   aspirin EC 81 MG tablet    Sig: Take 1 tablet (81 mg total) by mouth daily. Swallow whole.    Dispense:  90 tablet    Refill:  3     Patient Instructions  Medication Instructions:   Your physician has recommended you make the following change in your medication:  START: Aspirin 81 mg once daily START: Crestor 10 mg once daily  *If you need a refill on your cardiac medications before your next appointment, please call your pharmacy*   Lab Work: Your physician recommends that you return for lab work in:  TODAY: BMET, Mag, CBC  If you have labs (blood work) drawn today and your tests are completely normal, you will receive your results only by: MyChart Message (if you have MyChart) OR A paper copy in the mail If you have any lab test that is abnormal or we need to change your treatment, we will call you to review the results.   Testing/Procedures:    [facility] CARDIOVASCULAR DIVISION [facility] [address] [place] [address] Dept: [phone] Loc: [phone]  TOMEKO SCOVILLE  07/23/2020  You are scheduled for a Cardiac Catheterization on Friday, July 1 with Dr. Lance Muss.  1. Please arrive at the [facility] (Main Entrance A) at [facility]: [address] [place], [place] [address] at 5:30 AM  (This time is two hours before your procedure to ensure your preparation). Free valet parking service is available.   Special note: Every effort is made to have your procedure done on time. Please understand that emergencies sometimes delay scheduled procedures.  2. Diet: Do not eat solid foods after midnight.  The patient may have clear liquids until 5am upon the day of the procedure.  3. Labs: You will need to have blood drawn on TODAY.  4. Medication instructions in preparation for your procedure:   Contrast Allergy: No   On the morning  of your procedure, take your Aspirin and any morning medicines NOT listed above.  You may use sips of water.  5. Plan for one night stay--bring personal belongings. 6. Bring a current list of your medications and current insurance cards. 7. You MUST have a responsible person to drive you home. 8. Someone MUST be with you the first 24 hours after you arrive home or your discharge will be delayed. 9. Please wear  clothes that are easy to get on and off and wear slip-on shoes.  Thank you for allowing us to care for you!   -- [facility]    Follow-Up: At [facility], you and your health needs are our priority.  As part of our continuing mission to provide you with exceptional heart care, we have created designated Provider Care Teams.  These Care Teams include your primary Cardiologist (physician) and Advanced Practice Providers (APPs -  Physician Assistants and Nurse Practitioners) who all work together to provide you with the care you need, when you need it.  We recommend signing up for the patient portal called "MyChart".  Sign up information is provided on this After Visit Summary.  MyChart is used to connect with patients for Virtual Visits (Telemedicine).  Patients are able to view lab/test results, encounter notes, upcoming appointments, etc.  Non-urgent messages can be sent to your provider as well.   To learn more about what you can do with MyChart, go to [email or web address].    Your next appointment:   8 week(s)  The format for your next appointment:   In Person   Other Instructions    Adopting a Healthy Lifestyle.  Know what a healthy weight is for you (roughly BMI <25) and aim to maintain this   Aim for 7+ servings of fruits and vegetables daily   65-80+ fluid ounces of water or unsweet tea for healthy kidneys   Limit to max 1 drink of alcohol per day; avoid smoking/tobacco   Limit animal fats in diet for cholesterol and heart  health - choose grass fed whenever available   Avoid highly processed foods, and foods high in saturated/trans fats   Aim for low stress - take time to unwind and care for your mental health   Aim for 150 min of moderate intensity exercise weekly for heart health, and weights twice weekly for bone health   Aim for 7-9 hours of sleep daily   When it comes to diets, agreement about the perfect plan isnt easy to find, even among the experts. Experts at the [employer] developed an idea known as the Healthy Eating Plate. Just imagine a plate divided into logical, healthy portions.   The emphasis is on diet quality:   Load up on vegetables and fruits - one-half of your plate: Aim for color and variety, and remember that potatoes dont count.   Go for whole grains - one-quarter of your plate: Whole wheat, barley, wheat berries, quinoa, oats, brown rice, and foods made with them. If you want pasta, go with whole wheat pasta.   Protein power - one-quarter of your plate: Fish, chicken, beans, and nuts are all healthy, versatile protein sources. Limit red meat.   The diet, however, does go beyond the plate, offering a few other suggestions.   Use healthy plant oils, such as olive, canola, soy, corn, sunflower and peanut. Check the labels, and avoid partially hydrogenated oil, which have unhealthy trans fats.   If youre thirsty, drink water. Coffee and tea are good in moderation, but skip sugary drinks and limit milk and dairy products to one or two daily servings.   The type of carbohydrate in the diet is more important than the amount. Some sources of carbohydrates, such as vegetables, fruits, whole grains, and beans-are healthier than others.   Finally, stay active  Signed, Wesam Gearhart, DO  07/23/2020 5:12 PM    [facility]   HeartCare  

## 2020-07-23 NOTE — Progress Notes (Signed)
Cardiology Office Note:    Date:  07/23/2020   ID:  Charlene Jones, DOB 08-Aug-1950, MRN [id number]  PCP:  Krystal Clark, NP  Cardiologist:  Thomasene Ripple, DO  Electrophysiologist:  None   Referring MD: Rhea Bleacher*   No chief complaint on file. " I am doing ok"  History of Present Illness:    Charlene Jones is a 70 y.o. female with a hx of hyperlipidemia, hypertension is here today for follow-up.  I saw the patient on July 02, 2020 at that time she was being evaluated for intermittent chest discomfort and shortness of breath.  At that time given her risk factors as well as her abnormal EKG I recommended the patient undergo a nuclear stress test.  She was able to get her exercise nuclear stress test and is here today to discuss result.  She also did have bilateral leg edema that day and an echocardiogram was ordered.  I recommended low-dose diuretics but the patient declined.  Past Medical History:  Diagnosis Date   Abnormal EKG 06/24/2020   Essential hypertension 12/31/2017   Hyperlipemia, mixed 12/04/2015   Malaise and fatigue 12/04/2015   Numbness of left foot 10/24/2019   Oral thrush 03/13/2020   Osteopenia 12/31/2017   Formatting of this note might be different from the original. FRAX 14% and 1.2%  11/2017   Personal history of COVID-19 03/13/2020    Past Surgical History:  Procedure Laterality Date   EXCISIONAL HEMORRHOIDECTOMY     with stapling   HAND SURGERY Left 11/29/2019   thumb, cmc joint resection orthroplasty, Dr. Loralie Champagne @ [facility]   WRIST SURGREY  2014    Current Medications: Current Meds  Medication Sig   aspirin EC 81 MG tablet Take 1 tablet (81 mg total) by mouth daily. Swallow whole.   Cholecalciferol 25 MCG (1000 UT) tablet Take 1,000 Units by mouth daily at 6 (six) AM.   Multiple Vitamins-Minerals (MULTIVITAMIN WITH MINERALS) tablet Take 1 tablet by mouth daily.   Omega-3 1000 MG CAPS Take 1 g by mouth daily at 6 (six) AM.   Red Yeast  Rice Extract (RED YEAST RICE PO) Take 1 tablet by mouth daily at 6 (six) AM.   rosuvastatin (CRESTOR) 10 MG tablet Take 1 tablet (10 mg total) by mouth daily.     Allergies:   Cephalexin and Codeine   Social History   Socioeconomic History   Marital status: Married    Spouse name: Not on file   Number of children: Not on file   Years of education: Not on file   Highest education level: Not on file  Occupational History   Not on file  Tobacco Use   Smoking status: Never   Smokeless tobacco: Never  Substance and Sexual Activity   Alcohol use: Not Currently   Drug use: Never   Sexual activity: Not on file  Other Topics Concern   Not on file  Social History Narrative   Not on file   Social Determinants of Health   Financial Resource Strain: Not on file  Food Insecurity: Not on file  Transportation Needs: Not on file  Physical Activity: Not on file  Stress: Not on file  Social Connections: Not on file     Family History: The patient's family history includes Alzheimer's disease in her mother; Breast cancer in her paternal aunt; CAD in her father; Chorea in her mother; Colon cancer in her maternal grandmother; Dementia in her paternal grandmother; Heart attack (  age of onset: 25) in her father; Stomach cancer in her paternal grandfather; Stroke in her maternal grandfather.  ROS:   Review of Systems  Constitution: Negative for decreased appetite, fever and weight gain.  HENT: Negative for congestion, ear discharge, hoarse voice and sore throat.   Eyes: Negative for discharge, redness, vision loss in right eye and visual halos.  Cardiovascular: Negative for chest pain, dyspnea on exertion, leg swelling, orthopnea and palpitations.  Respiratory: Negative for cough, hemoptysis, shortness of breath and snoring.   Endocrine: Negative for heat intolerance and polyphagia.  Hematologic/Lymphatic: Negative for bleeding problem. Does not bruise/bleed easily.  Skin: Negative for  flushing, nail changes, rash and suspicious lesions.  Musculoskeletal: Negative for arthritis, joint pain, muscle cramps, myalgias, neck pain and stiffness.  Gastrointestinal: Negative for abdominal pain, bowel incontinence, diarrhea and excessive appetite.  Genitourinary: Negative for decreased libido, genital sores and incomplete emptying.  Neurological: Negative for brief paralysis, focal weakness, headaches and loss of balance.  Psychiatric/Behavioral: Negative for altered mental status, depression and suicidal ideas.  Allergic/Immunologic: Negative for HIV exposure and persistent infections.    EKGs/Labs/Other Studies Reviewed:    The following studies were reviewed today:   EKG:  None today  Exercise  The left ventricular ejection fraction is hyperdynamic (>65%). Nuclear stress EF: 77%. There was no ST segment deviation noted during stress. No T wave inversion was noted during stress. 6 beat run of nonsustained ventricular tachycardia at peak exercise. Exercise duration 8 minutes 16 seconds, achieving 92% of maximal predicted heart rate, at 10.1 METS. Blood pressure response was normal with exercise. Defect 1: There is a small reversible defect of mild severity present in the basal inferior location. Findings consistent with ischemia. This is an intermediate risk study, due to ischemia as well as nonsustained ventricular tachycardia during peak exercise.    Recent Labs: No results found for requested labs within last 8760 hours.  Recent Lipid Panel No results found for: CHOL, TRIG, HDL, CHOLHDL, VLDL, LDLCALC, LDLDIRECT  Physical Exam:    VS:  BP 110/60   Pulse 83   Ht 5\' 5"  (1.651 m)   Wt 143 lb 12.8 oz (65.2 kg)   SpO2 97%   BMI 23.93 kg/m     Wt Readings from Last 3 Encounters:  07/23/20 143 lb 12.8 oz (65.2 kg)  07/17/20 145 lb (65.8 kg)  07/02/20 145 lb (65.8 kg)     GEN: Well nourished, well developed in no acute distress HEENT: Normal NECK: No JVD; No  carotid bruits LYMPHATICS: No lymphadenopathy CARDIAC: S1S2 noted,RRR, no murmurs, rubs, gallops RESPIRATORY:  Clear to auscultation without rales, wheezing or rhonchi  ABDOMEN: Soft, non-tender, non-distended, +bowel sounds, no guarding. EXTREMITIES: No edema, No cyanosis, no clubbing MUSCULOSKELETAL:  No deformity  SKIN: Warm and dry NEUROLOGIC:  Alert and oriented x 3, non-focal PSYCHIATRIC:  Normal affect, good insight  ASSESSMENT:    1. Abnormal nuclear stress test   2. Essential hypertension   3. Hyperlipemia, mixed   4. NSVT (nonsustained ventricular tachycardia) (HCC)    PLAN:    Her nuclear stress test is abnormal at this time I discussed with the patient given her risk factors to proceed with left heart catheterization.  The patient understands that risks include but are not limited to stroke (1 in 1000), death (1 in 1000), kidney failure [usually temporary] (1 in 500), bleeding (1 in 200), allergic reaction [possibly serious] (1 in 200), and agrees to proceed.  We will start the patient  on aspirin 81 mg daily as well as Crestor 5 mg daily.  She is very hesitant to start the statin medication.  But her last lipid profile showed LDL 185, total cholesterol 258, triglyceride 84 and HDL 65.  She would rather take red yeast rice.  But for now she is willing to try Crestor 5 mg daily for  Her blood pressure acceptable today.  The patient is in agreement with the above plan. The patient left the office in stable condition.  The patient will follow up in 4 weeks post heart catheterization.   Medication Adjustments/Labs and Tests Ordered: Current medicines are reviewed at length with the patient today.  Concerns regarding medicines are outlined above.  Orders Placed This Encounter  Procedures   Basic metabolic panel   Magnesium   CBC with Differential/Platelet    Meds ordered this encounter  Medications   rosuvastatin (CRESTOR) 10 MG tablet    Sig: Take 1 tablet (10 mg  total) by mouth daily.    Dispense:  90 tablet    Refill:  3   aspirin EC 81 MG tablet    Sig: Take 1 tablet (81 mg total) by mouth daily. Swallow whole.    Dispense:  90 tablet    Refill:  3     Patient Instructions  Medication Instructions:   Your physician has recommended you make the following change in your medication:  START: Aspirin 81 mg once daily START: Crestor 10 mg once daily  *If you need a refill on your cardiac medications before your next appointment, please call your pharmacy*   Lab Work: Your physician recommends that you return for lab work in:  TODAY: BMET, Mag, CBC  If you have labs (blood work) drawn today and your tests are completely normal, you will receive your results only by: MyChart Message (if you have MyChart) OR A paper copy in the mail If you have any lab test that is abnormal or we need to change your treatment, we will call you to review the results.   Testing/Procedures:    [facility]OVASCULAR [facility] AT [place] [address] [place] [address] Dept: [phone] Loc: [phone]  Charlene Jones  07/23/2020  You are scheduled for a Cardiac Catheterization on Friday, July 1 with Dr. Lance Muss.  1. Please arrive at the [facility] (Main Entrance A) at [facility]: [address] [place], [place] [address] at 5:30 AM  (This time is two hours before your procedure to ensure your preparation). Free valet parking service is available.   Special note: Every effort is made to have your procedure done on time. Please understand that emergencies sometimes delay scheduled procedures.  2. Diet: Do not eat solid foods after midnight.  The patient may have clear liquids until 5am upon the day of the procedure.  3. Labs: You will need to have blood drawn on TODAY.  4. Medication instructions in preparation for your procedure:   Contrast Allergy: No   On the morning  of your procedure, take your Aspirin and any morning medicines NOT listed above.  You may use sips of water.  5. Plan for one night stay--bring personal belongings. 6. Bring a current list of your medications and current insurance cards. 7. You MUST have a responsible person to drive you home. 8. Someone MUST be with you the first 24 hours after you arrive home or your discharge will be delayed. 9. Please wear  clothes that are easy to get on and off and wear slip-on shoes.  Thank you for allowing us to care for you!   -- [place] [facility]    Follow-Up: At [facility], you and your health needs are our priority.  As part of our continuing mission to provide you with exceptional heart care, we have created designated Provider Care Teams.  These Care Teams include your primary Cardiologist (physician) and Advanced Practice Providers (APPs -  Physician Assistants and Nurse Practitioners) who all work together to provide you with the care you need, when you need it.  We recommend signing up for the patient portal called "MyChart".  Sign up information is provided on this After Visit Summary.  MyChart is used to connect with patients for Virtual Visits (Telemedicine).  Patients are able to view lab/test results, encounter notes, upcoming appointments, etc.  Non-urgent messages can be sent to your provider as well.   To learn more about what you can do with MyChart, go to [email or web address].    Your next appointment:   8 week(s)  The format for your next appointment:   In Person   Other Instructions    Adopting a Healthy Lifestyle.  Know what a healthy weight is for you (roughly BMI <25) and aim to maintain this   Aim for 7+ servings of fruits and vegetables daily   65-80+ fluid ounces of water or unsweet tea for healthy kidneys   Limit to max 1 drink of alcohol per day; avoid smoking/tobacco   Limit animal fats in diet for cholesterol and heart  health - choose grass fed whenever available   Avoid highly processed foods, and foods high in saturated/trans fats   Aim for low stress - take time to unwind and care for your mental health   Aim for 150 min of moderate intensity exercise weekly for heart health, and weights twice weekly for bone health   Aim for 7-9 hours of sleep daily   When it comes to diets, agreement about the perfect plan isnt easy to find, even among the experts. Experts at the [employer] developed an idea known as the Healthy Eating Plate. Just imagine a plate divided into logical, healthy portions.   The emphasis is on diet quality:   Load up on vegetables and fruits - one-half of your plate: Aim for color and variety, and remember that potatoes dont count.   Go for whole grains - one-quarter of your plate: Whole wheat, barley, wheat berries, quinoa, oats, brown rice, and foods made with them. If you want pasta, go with whole wheat pasta.   Protein power - one-quarter of your plate: Fish, chicken, beans, and nuts are all healthy, versatile protein sources. Limit red meat.   The diet, however, does go beyond the plate, offering a few other suggestions.   Use healthy plant oils, such as olive, canola, soy, corn, sunflower and peanut. Check the labels, and avoid partially hydrogenated oil, which have unhealthy trans fats.   If youre thirsty, drink water. Coffee and tea are good in moderation, but skip sugary drinks and limit milk and dairy products to one or two daily servings.   The type of carbohydrate in the diet is more important than the amount. Some sources of carbohydrates, such as vegetables, fruits, whole grains, and beans-are healthier than others.   Finally, stay active  Signed, Thomasene RippleKardie Giannis Corpuz, DO  07/23/2020 5:12 PM    [facility]  

## 2020-07-23 NOTE — Patient Instructions (Addendum)
Medication Instructions:   Your physician has recommended you make the following change in your medication:  START: Aspirin 81 mg once daily START: Crestor 10 mg once daily  *If you need a refill on your cardiac medications before your next appointment, please call your pharmacy*   Lab Work: Your physician recommends that you return for lab work in:  TODAY: BMET, Mag, CBC  If you have labs (blood work) drawn today and your tests are completely normal, you will receive your results only by: MyChart Message (if you have MyChart) OR A paper copy in the mail If you have any lab test that is abnormal or we need to change your treatment, we will call you to review the results.   Testing/Procedures:    [place] [facility] C[facility]IOVASCULAR DIVISION [facility] AT [place] [address] [place] [address] Dept: [phone] Loc: [phone]  Charlene Jones  07/23/2020  You are scheduled for a Cardiac Catheterization on Friday, July 1 with Dr. Lance Muss.  1. Please arrive at the [facility] (Main Entrance A) at [facility]: [address] [place], [place] [address] at 5:30 AM  (This time is two hours before your procedure to ensure your preparation). Free valet parking service is available.   Special note: Every effort is made to have your procedure done on time. Please understand that emergencies sometimes delay scheduled procedures.  2. Diet: Do not eat solid foods after midnight.  The patient may have clear liquids until 5am upon the day of the procedure.  3. Labs: You will need to have blood drawn on TODAY.  4. Medication instructions in preparation for your procedure:   Contrast Allergy: No   On the morning of your procedure, take your Aspirin and any morning medicines NOT listed above.  You may use sips of water.  5. Plan for one night stay--bring personal belongings. 6. Bring a current list of your medications and current  insurance cards. 7. You MUST have a responsible person to drive you home. 8. Someone MUST be with you the first 24 hours after you arrive home or your discharge will be delayed. 9. Please wear clothes that are easy to get on and off and wear slip-on shoes.  Thank you for allowing [place] to care for you!   -- [place] [facility]    Follow-Up: At [facility], you and your health needs are our priority.  As part of our continuing mission to provide you with exceptional heart care, we have created designated Provider Care Teams.  These Care Teams include your primary Cardiologist (physician) and Advanced Practice Providers (APPs -  Physician Assistants and Nurse Practitioners) who all work together to provide you with the care you need, when you need it.  We recommend signing up for the patient portal called "MyChart".  Sign up information is provided on this After Visit Summary.  MyChart is used to connect with patients for Virtual Visits (Telemedicine).  Patients are able to view lab/test results, encounter notes, upcoming appointments, etc.  Non-urgent messages can be sent to your provider as well.   To learn more about what you can do with MyChart, go to [email or web address].    Your next appointment:   8 week(s)  The format for your next appointment:   In Person   Other Instructions

## 2020-07-24 LAB — CBC WITH DIFFERENTIAL/PLATELET
Basophils Absolute: 0 10*3/uL (ref 0.0–0.2)
Basos: 1 %
EOS (ABSOLUTE): 0.1 10*3/uL (ref 0.0–0.4)
Eos: 2 %
Hematocrit: 47.6 % — ABNORMAL HIGH (ref 34.0–46.6)
Hemoglobin: 15.5 g/dL (ref 11.1–15.9)
Immature Grans (Abs): 0 10*3/uL (ref 0.0–0.1)
Immature Granulocytes: 0 %
Lymphocytes Absolute: 1.3 10*3/uL (ref 0.7–3.1)
Lymphs: 32 %
MCH: 30.4 pg (ref 26.6–33.0)
MCHC: 32.6 g/dL (ref 31.5–35.7)
MCV: 93 fL (ref 79–97)
Monocytes Absolute: 0.3 10*3/uL (ref 0.1–0.9)
Monocytes: 8 %
Neutrophils Absolute: 2.2 10*3/uL (ref 1.4–7.0)
Neutrophils: 57 %
Platelets: 247 10*3/uL (ref 150–450)
RBC: 5.1 x10E6/uL (ref 3.77–5.28)
RDW: 12.2 % (ref 11.7–15.4)
WBC: 4 10*3/uL (ref 3.4–10.8)

## 2020-07-24 LAB — BASIC METABOLIC PANEL
BUN/Creatinine Ratio: 24 (ref 12–28)
BUN: 14 mg/dL (ref 8–27)
CO2: 21 mmol/L (ref 20–29)
Calcium: 9.4 mg/dL (ref 8.7–10.3)
Chloride: 97 mmol/L (ref 96–106)
Creatinine, Ser: 0.58 mg/dL (ref 0.57–1.00)
Glucose: 91 mg/dL (ref 65–99)
Potassium: 4.2 mmol/L (ref 3.5–5.2)
Sodium: 139 mmol/L (ref 134–144)
eGFR: 99 mL/min/{1.73_m2} (ref >59)

## 2020-07-24 LAB — MAGNESIUM: Magnesium: 2.1 mg/dL (ref 1.6–2.3)

## 2020-07-25 ENCOUNTER — Telehealth: Payer: Self-pay | Admitting: *Deleted

## 2020-07-25 ENCOUNTER — Telehealth: Payer: Self-pay | Admitting: Cardiology

## 2020-07-25 NOTE — Telephone Encounter (Signed)
Charlene Jones addressed this.

## 2020-07-25 NOTE — Telephone Encounter (Signed)
Pt contacted pre-catheterization scheduled at [facility] for: Friday July 26, 2020 7:30 AM Verified arrival time and place: [facility] Main Entrance A [facility]) at: 5:30 AM   No solid food after midnight prior to cath, clear liquids until 5 AM day of procedure.   AM meds can be  taken pre-cath with sips of water including: aspirin 81 mg   Confirmed patient has responsible adult to drive home post procedure and be with patient first 24 hours after arriving home: yes  You are allowed ONE visitor in the waiting room during the time you are at the hospital for your procedure. Both you and your visitor must wear a mask once you enter the hospital.   Patient reports does not currently have any symptoms concerning for COVID-19 and no household members with COVID-19 like illness.   Reviewed procedure/mask/visitor instructions with patient.  Patient advised to avoid creams and lotions morning of cath.

## 2020-07-25 NOTE — Telephone Encounter (Signed)
New message:    Patient calling to see if she can make up and lotions when she go CATH tomorrow. Please advise.

## 2020-07-25 NOTE — Telephone Encounter (Signed)
Left message for patient to return call.

## 2020-07-25 NOTE — Telephone Encounter (Signed)
Follow up:   Patient returning a call back.  

## 2020-07-26 ENCOUNTER — Other Ambulatory Visit: Payer: BLUE CROSS/BLUE SHIELD

## 2020-07-26 ENCOUNTER — Ambulatory Visit (HOSPITAL_COMMUNITY)
Admission: RE | Admit: 2020-07-26 | Discharge: 2020-07-26 | Disposition: A | Discharge status: Home / Self Care | Payer: Medicare Other | Attending: Interventional Cardiology | Admitting: Interventional Cardiology

## 2020-07-26 ENCOUNTER — Encounter (HOSPITAL_COMMUNITY)
Admission: RE | Disposition: A | Discharge status: Home / Self Care | Payer: Self-pay | Source: Home / Self Care | Attending: Interventional Cardiology

## 2020-07-26 ENCOUNTER — Encounter (HOSPITAL_COMMUNITY): Payer: Self-pay | Admitting: Interventional Cardiology

## 2020-07-26 ENCOUNTER — Other Ambulatory Visit: Payer: Self-pay

## 2020-07-26 DIAGNOSIS — Z7982 Long term (current) use of aspirin: Secondary | ICD-10-CM | POA: Diagnosis not present

## 2020-07-26 DIAGNOSIS — Z8616 Personal history of COVID-19: Secondary | ICD-10-CM | POA: Diagnosis not present

## 2020-07-26 DIAGNOSIS — I2510 Atherosclerotic heart disease of native coronary artery without angina pectoris: Secondary | ICD-10-CM | POA: Diagnosis not present

## 2020-07-26 DIAGNOSIS — I472 Ventricular tachycardia: Secondary | ICD-10-CM | POA: Diagnosis not present

## 2020-07-26 DIAGNOSIS — Z79899 Other long term (current) drug therapy: Secondary | ICD-10-CM | POA: Insufficient documentation

## 2020-07-26 DIAGNOSIS — Z885 Allergy status to narcotic agent status: Secondary | ICD-10-CM | POA: Diagnosis not present

## 2020-07-26 DIAGNOSIS — E785 Hyperlipidemia, unspecified: Secondary | ICD-10-CM | POA: Insufficient documentation

## 2020-07-26 DIAGNOSIS — Z881 Allergy status to other antibiotic agents status: Secondary | ICD-10-CM | POA: Diagnosis not present

## 2020-07-26 DIAGNOSIS — R9439 Abnormal result of other cardiovascular function study: Secondary | ICD-10-CM | POA: Diagnosis present

## 2020-07-26 DIAGNOSIS — I10 Essential (primary) hypertension: Secondary | ICD-10-CM | POA: Insufficient documentation

## 2020-07-26 DIAGNOSIS — R600 Localized edema: Secondary | ICD-10-CM | POA: Diagnosis not present

## 2020-07-26 HISTORY — PX: LEFT HEART CATH AND CORONARY ANGIOGRAPHY: CATH118249

## 2020-07-26 SURGERY — LEFT HEART CATH AND CORONARY ANGIOGRAPHY
Anesthesia: LOCAL

## 2020-07-26 MED ORDER — HEPARIN (PORCINE) IN NACL 1000-0.9 UT/500ML-% IV SOLN
INTRAVENOUS | Status: DC | PRN
  Administered 2020-07-26 (×2): 500 mL

## 2020-07-26 MED ORDER — SODIUM CHLORIDE 0.9% FLUSH: 3.0000 mL | INTRAVENOUS | Status: DC | PRN

## 2020-07-26 MED ORDER — SODIUM CHLORIDE 0.9 % IV SOLN: 250.0000 mL | INTRAVENOUS | Status: DC | PRN

## 2020-07-26 MED ORDER — SODIUM CHLORIDE 0.9% FLUSH: 3.0000 mL | Freq: Two times a day (BID) | INTRAVENOUS | Status: DC

## 2020-07-26 MED ORDER — FENTANYL CITRATE (PF) 100 MCG/2ML IJ SOLN
INTRAMUSCULAR | Status: AC
  Filled 2020-07-26: qty 2

## 2020-07-26 MED ORDER — IOHEXOL 350 MG/ML SOLN
INTRAVENOUS | Status: DC | PRN
  Administered 2020-07-26: 55 mL

## 2020-07-26 MED ORDER — SODIUM CHLORIDE 0.9 % WEIGHT BASED INFUSION
3.0000 mL/kg/h | INTRAVENOUS | Status: AC
  Administered 2020-07-26: 3 mL/kg/h via INTRAVENOUS

## 2020-07-26 MED ORDER — HEPARIN SODIUM (PORCINE) 1000 UNIT/ML IJ SOLN
INTRAMUSCULAR | Status: AC
  Filled 2020-07-26: qty 1

## 2020-07-26 MED ORDER — ONDANSETRON HCL 4 MG/2ML IJ SOLN: 4.0000 mg | Freq: Four times a day (QID) | INTRAMUSCULAR | Status: DC | PRN

## 2020-07-26 MED ORDER — HEPARIN (PORCINE) IN NACL 1000-0.9 UT/500ML-% IV SOLN
INTRAVENOUS | Status: AC
  Filled 2020-07-26: qty 500

## 2020-07-26 MED ORDER — LIDOCAINE HCL (PF) 1 % IJ SOLN
INTRAMUSCULAR | Status: AC
  Filled 2020-07-26: qty 30

## 2020-07-26 MED ORDER — MIDAZOLAM HCL 2 MG/2ML IJ SOLN
INTRAMUSCULAR | Status: AC
  Filled 2020-07-26: qty 2

## 2020-07-26 MED ORDER — HEPARIN SODIUM (PORCINE) 1000 UNIT/ML IJ SOLN
INTRAMUSCULAR | Status: DC | PRN
  Administered 2020-07-26: 3000 [IU] via INTRAVENOUS

## 2020-07-26 MED ORDER — MIDAZOLAM HCL 2 MG/2ML IJ SOLN
INTRAMUSCULAR | Status: DC | PRN
  Administered 2020-07-26: 2 mg via INTRAVENOUS
  Administered 2020-07-26: 1 mg via INTRAVENOUS

## 2020-07-26 MED ORDER — VERAPAMIL HCL 2.5 MG/ML IV SOLN
INTRAVENOUS | Status: AC
  Filled 2020-07-26: qty 2

## 2020-07-26 MED ORDER — SODIUM CHLORIDE 0.9 % WEIGHT BASED INFUSION: 1.0000 mL/kg/h | INTRAVENOUS | Status: DC

## 2020-07-26 MED ORDER — SODIUM CHLORIDE 0.9 % IV SOLN: INTRAVENOUS | Status: AC

## 2020-07-26 MED ORDER — HYDRALAZINE HCL 20 MG/ML IJ SOLN: 10.0000 mg | INTRAMUSCULAR | Status: DC | PRN

## 2020-07-26 MED ORDER — FENTANYL CITRATE (PF) 100 MCG/2ML IJ SOLN
INTRAMUSCULAR | Status: DC | PRN
  Administered 2020-07-26 (×2): 25 ug via INTRAVENOUS

## 2020-07-26 MED ORDER — LABETALOL HCL 5 MG/ML IV SOLN: 10.0000 mg | INTRAVENOUS | Status: DC | PRN

## 2020-07-26 MED ORDER — HEPARIN (PORCINE) IN NACL 2-0.9 UNITS/ML
INTRAMUSCULAR | Status: DC | PRN
  Administered 2020-07-26 (×2): 10 mL via INTRA_ARTERIAL

## 2020-07-26 MED ORDER — ACETAMINOPHEN 325 MG PO TABS: 650.0000 mg | ORAL_TABLET | ORAL | Status: DC | PRN

## 2020-07-26 MED ORDER — LIDOCAINE HCL (PF) 1 % IJ SOLN
INTRAMUSCULAR | Status: DC | PRN
  Administered 2020-07-26: 2 mL

## 2020-07-26 SURGICAL SUPPLY — 8 items
CATH 5FR JL3.5 JR4 ANG PIG MP (CATHETERS) ×2 IMPLANT
DEVICE RAD TR BAND REGULAR (VASCULAR PRODUCTS) ×2 IMPLANT
GLIDESHEATH SLEND SS 6F .021 (SHEATH) ×2 IMPLANT
KIT HEART LEFT (KITS) ×2 IMPLANT
PACK CARDIAC CATHETERIZATION (CUSTOM PROCEDURE TRAY) ×2 IMPLANT
TRANSDUCER W/STOPCOCK (MISCELLANEOUS) ×2 IMPLANT
TUBING CIL FLEX 10 FLL-RA (TUBING) ×2 IMPLANT
WIRE HI TORQ VERSACORE J 260CM (WIRE) ×2 IMPLANT

## 2020-07-26 NOTE — Interval H&P Note (Signed)
Cath Lab Visit (complete for each Cath Lab visit)  Clinical Evaluation Leading to the Procedure:   ACS: No.  Non-ACS:    Anginal Classification: CCS III  Anti-ischemic medical therapy: Minimal Therapy (1 class of medications)  Non-Invasive Test Results: Intermediate-risk stress test findings: cardiac mortality 1-3%/year  Prior CABG: No previous CABG      History and Physical Interval Note:  07/26/2020 7:38 AM  Charlene Jones  has presented today for surgery, with the diagnosis of abnormal stress test.  The various methods of treatment have been discussed with the patient and family. After consideration of risks, benefits and other options for treatment, the patient has consented to  Procedure(s): LEFT HEART CATH AND CORONARY ANGIOGRAPHY (N/A) as a surgical intervention.  The patient's history has been reviewed, patient examined, no change in status, stable for surgery.  I have reviewed the patient's chart and labs.  Questions were answered to the patient's satisfaction.     Lance Muss

## 2020-07-26 NOTE — Research (Signed)
IDENTIFY CADFEM Informed Consent   Subject Name: Charlene Jones  Subject met inclusion and exclusion criteria.  The informed consent form, study requirements and expectations were reviewed with the subject and questions and concerns were addressed prior to the signing of the consent form.  The subject verbalized understanding of the trail requirements.  The subject agreed to participate in the INDENTIFY CADFEM trial and signed the informed consent.  The informed consent was obtained prior to performance of any protocol-specific procedures for the subject.  A copy of the signed informed consent was given to the subject and a copy was placed in the subject's medical record.  [name] 07/26/2020, 9:23 AM

## 2020-08-02 ENCOUNTER — Telehealth: Payer: Self-pay | Admitting: Cardiology

## 2020-08-02 NOTE — Telephone Encounter (Signed)
Follow up:    Patient returning a call back for results 

## 2020-08-02 NOTE — Telephone Encounter (Signed)
    Pt c/o medication issue:  1. Name of Medication: cholesterol med  2. How are you currently taking this medication (dosage and times per day)?   3. Are you having a reaction (difficulty breathing--STAT)?   4. What is your medication issue? Pt would like to know if she can stop taking her cholesterol medication

## 2020-08-02 NOTE — Telephone Encounter (Signed)
Left message for patient to return our call.

## 2020-08-02 NOTE — Telephone Encounter (Signed)
Encounter not needed

## 2020-08-06 ENCOUNTER — Other Ambulatory Visit: Payer: Self-pay

## 2020-08-06 ENCOUNTER — Encounter: Payer: Self-pay | Admitting: Cardiology

## 2020-08-06 ENCOUNTER — Ambulatory Visit (INDEPENDENT_AMBULATORY_CARE_PROVIDER_SITE_OTHER): Payer: Medicare Other | Admitting: Cardiology

## 2020-08-06 ENCOUNTER — Ambulatory Visit: Payer: BLUE CROSS/BLUE SHIELD | Admitting: Cardiology

## 2020-08-06 VITALS — BP 159/102 | HR 66 | Ht 65.0 in | Wt 146.0 lb

## 2020-08-06 DIAGNOSIS — I10 Essential (primary) hypertension: Secondary | ICD-10-CM

## 2020-08-06 DIAGNOSIS — I2510 Atherosclerotic heart disease of native coronary artery without angina pectoris: Secondary | ICD-10-CM | POA: Diagnosis not present

## 2020-08-06 DIAGNOSIS — I4729 Other ventricular tachycardia: Secondary | ICD-10-CM

## 2020-08-06 DIAGNOSIS — I472 Ventricular tachycardia: Secondary | ICD-10-CM | POA: Diagnosis not present

## 2020-08-06 DIAGNOSIS — E782 Mixed hyperlipidemia: Secondary | ICD-10-CM

## 2020-08-06 MED ORDER — NITROGLYCERIN 0.4 MG SL SUBL: 0.4000 mg | SUBLINGUAL_TABLET | SUBLINGUAL | 3 refills | Status: AC | PRN

## 2020-08-06 MED ORDER — AMLODIPINE BESYLATE 5 MG PO TABS: 5.0000 mg | ORAL_TABLET | Freq: Every day | ORAL | 3 refills | Status: DC

## 2020-08-06 MED ORDER — ROSUVASTATIN CALCIUM 10 MG PO TABS: 10.0000 mg | ORAL_TABLET | Freq: Every day | ORAL | 3 refills | Status: DC

## 2020-08-06 NOTE — Patient Instructions (Addendum)
Medication Instructions:  Your physician has recommended you make the following change in your medication:  START: Nitroglycerin 0.4 mg take one tablet by mouth every 5 minutes up to three times as needed for chest pain. START: Amlodipine 5 mg one daily   *If you need a refill on your cardiac medications before your next appointment, please call your pharmacy*   Lab Work: None If you have labs (blood work) drawn today and your tests are completely normal, you will receive your results only by: MyChart Message (if you have MyChart) OR A paper copy in the mail If you have any lab test that is abnormal or we need to change your treatment, we will call you to review the results.   Testing/Procedures: None   Follow-Up: At [facility], you and your health needs are our priority.  As part of our continuing mission to provide you with exceptional heart care, we have created designated Provider Care Teams.  These Care Teams include your primary Cardiologist (physician) and Advanced Practice Providers (APPs -  Physician Assistants and Nurse Practitioners) who all work together to provide you with the care you need, when you need it.  We recommend signing up for the patient portal called "MyChart".  Sign up information is provided on this After Visit Summary.  MyChart is used to connect with patients for Virtual Visits (Telemedicine).  Patients are able to view lab/test results, encounter notes, upcoming appointments, etc.  Non-urgent messages can be sent to your provider as well.   To learn more about what you can do with MyChart, go to [email or web address].    Your next appointment:   1 year(s)  The format for your next appointment:   In Person  Provider:   [address] - Thomasene Ripple, DO    Other Instructions

## 2020-08-06 NOTE — Progress Notes (Signed)
Cardiology Office Note:    Date:  08/06/2020   ID:  Charlene Jones, DOB 03-19-1950, MRN [id number]  PCP:  Krystal Clark, NP  Cardiologist:  Thomasene Ripple, DO  Electrophysiologist:  None   Referring MD: Rhea Bleacher*   " I am doing fine"   History of Present Illness:    Charlene Jones is a 70 y.o. female with a hx of nonobstructive coronary artery disease, hypertension, hyperlipidemia is here today for follow-up visit.  I recently saw the patient to discuss her nuclear stress test which was abnormal and sent her for heart catheterization.  She underwent heart catheterization    Past Medical History:  Diagnosis Date   Abnormal EKG 06/24/2020   Abnormal nuclear stress test 07/23/2020   Bilateral leg edema 07/02/2020   Chest pain of uncertain etiology 07/02/2020   Essential hypertension 12/31/2017   Hyperlipemia, mixed 12/04/2015   Malaise and fatigue 12/04/2015   Mixed hyperlipidemia 07/02/2020   Numbness of left foot 10/24/2019   Oral thrush 03/13/2020   Osteopenia 12/31/2017   Formatting of this note might be different from the original. FRAX 14% and 1.2%  11/2017   Personal history of COVID-19 03/13/2020   Shortness of breath 07/02/2020    Past Surgical History:  Procedure Laterality Date   EXCISIONAL HEMORRHOIDECTOMY     with stapling   HAND SURGERY Left 11/29/2019   thumb, cmc joint resection orthroplasty, Dr. Loralie Champagne @ [facility]   LEFT HEART CATH AND CORONARY ANGIOGRAPHY N/A 07/26/2020   Procedure: LEFT HEART CATH AND CORONARY ANGIOGRAPHY;  Surgeon: Corky Crafts, MD;  Location: [facility] INVASIVE CV LAB;  Service: Cardiovascular;  Laterality: N/A;   WRIST SURGREY  2014    Current Medications: Current Meds  Medication Sig   aspirin EC 81 MG tablet Take 1 tablet (81 mg total) by mouth daily. Swallow whole.   Cholecalciferol 50 MCG (2000 UT) CAPS Take 2,000 Units by mouth daily.   Multiple Vitamins-Minerals (MULTIVITAMIN WITH MINERALS) tablet Take 2 tablets by  mouth daily.   Omega-3 1000 MG CAPS Take 3,000 mg by mouth daily.   rosuvastatin (CRESTOR) 10 MG tablet Take 1 tablet (10 mg total) by mouth daily.     Allergies:   Cephalexin and Codeine   Social History   Socioeconomic History   Marital status: Married    Spouse name: Not on file   Number of children: Not on file   Years of education: Not on file   Highest education level: Not on file  Occupational History   Not on file  Tobacco Use   Smoking status: Never   Smokeless tobacco: Never  Substance and Sexual Activity   Alcohol use: Not Currently   Drug use: Never   Sexual activity: Not on file  Other Topics Concern   Not on file  Social History Narrative   Not on file   Social Determinants of Health   Financial Resource Strain: Not on file  Food Insecurity: Not on file  Transportation Needs: Not on file  Physical Activity: Not on file  Stress: Not on file  Social Connections: Not on file     Family History: The patient's family history includes Alzheimer's disease in her mother; Breast cancer in her paternal aunt; CAD in her father; Chorea in her mother; Colon cancer in her maternal grandmother; Dementia in her paternal grandmother; Heart attack (age of onset: 46) in her father; Stomach cancer in her paternal grandfather; Stroke in her maternal grandfather.  ROS:   Review of Systems  Constitution: Negative for decreased appetite, fever and weight gain.  HENT: Negative for congestion, ear discharge, hoarse voice and sore throat.   Eyes: Negative for discharge, redness, vision loss in right eye and visual halos.  Cardiovascular: Negative for chest pain, dyspnea on exertion, leg swelling, orthopnea and palpitations.  Respiratory: Negative for cough, hemoptysis, shortness of breath and snoring.   Endocrine: Negative for heat intolerance and polyphagia.  Hematologic/Lymphatic: Negative for bleeding problem. Does not bruise/bleed easily.  Skin: Negative for flushing, nail  changes, rash and suspicious lesions.  Musculoskeletal: Negative for arthritis, joint pain, muscle cramps, myalgias, neck pain and stiffness.  Gastrointestinal: Negative for abdominal pain, bowel incontinence, diarrhea and excessive appetite.  Genitourinary: Negative for decreased libido, genital sores and incomplete emptying.  Neurological: Negative for brief paralysis, focal weakness, headaches and loss of balance.  Psychiatric/Behavioral: Negative for altered mental status, depression and suicidal ideas.  Allergic/Immunologic: Negative for HIV exposure and persistent infections.    EKGs/Labs/Other Studies Reviewed:    The following studies were reviewed today:   EKG: None today  Left heart catheterization July 26, 2020 2nd Diag lesion is 50% stenosed. Very small vessel. Mid LAD lesion is 25% stenosed. Mid RCA lesion is 10% stenosed. The left ventricular systolic function is normal. LV end diastolic pressure is normal. The left ventricular ejection fraction is 55-65% by visual estimate. There is no aortic valve stenosis.   Nonobstructive CAD. Continue medical therapy.   Exercise  The left ventricular ejection fraction is hyperdynamic (>65%). Nuclear stress EF: 77%. There was no ST segment deviation noted during stress. No T wave inversion was noted during stress. 6 beat run of nonsustained ventricular tachycardia at peak exercise. Exercise duration 8 minutes 16 seconds, achieving 92% of maximal predicted heart rate, at 10.1 METS. Blood pressure response was normal with exercise. Defect 1: There is a small reversible defect of mild severity present in the basal inferior location. Findings consistent with ischemia. This is an intermediate risk study, due to ischemia as well as nonsustained ventricular tachycardia during peak exercise.  Recent Labs: 07/23/2020: BUN 14; Creatinine, Ser 0.58; Hemoglobin 15.5; Magnesium 2.1; Platelets 247; Potassium 4.2; Sodium 139  Recent Lipid  Panel No results found for: CHOL, TRIG, HDL, CHOLHDL, VLDL, LDLCALC, LDLDIRECT  Physical Exam:    VS:  BP (!) 159/102 (BP Location: Left Arm, Patient Position: Sitting, Cuff Size: Normal)   Pulse 66   Ht 5\' 5"  (1.651 m)   Wt 146 lb (66.2 kg)   SpO2 97%   BMI 24.30 kg/m     Wt Readings from Last 3 Encounters:  08/06/20 146 lb (66.2 kg)  07/26/20 144 lb (65.3 kg)  07/23/20 143 lb 12.8 oz (65.2 kg)     GEN: Well nourished, well developed in no acute distress HEENT: Normal NECK: No JVD; No carotid bruits LYMPHATICS: No lymphadenopathy CARDIAC: S1S2 noted,RRR, no murmurs, rubs, gallops RESPIRATORY:  Clear to auscultation without rales, wheezing or rhonchi  ABDOMEN: Soft, non-tender, non-distended, +bowel sounds, no guarding. EXTREMITIES: No edema, No cyanosis, no clubbing MUSCULOSKELETAL:  No deformity  SKIN: Warm and dry NEUROLOGIC:  Alert and oriented x 3, non-focal PSYCHIATRIC:  Normal affect, good insight  ASSESSMENT:    1. Coronary artery disease involving native coronary artery of native heart without angina pectoris   2. NSVT (nonsustained ventricular tachycardia) (HCC)   3. Essential hypertension   4. Mixed hyperlipidemia    PLAN:     1.  We talked  about her new diagnosis of coronary artery disease currently she does not have any angina symptoms continue patient on aspirin and Crestor.  We will continue to monitor..  Nitroglycerin will be ordered and the patient has been educated on how to use this medication 2.  She has hypertensive in the office I am going started patient on low-dose antihypertensive medication amlodipine 5 mg a day. 3.  Continue Crestor  The patient is in agreement with the above plan. The patient left the office in stable condition.  The patient will follow up in   Medication Adjustments/Labs and Tests Ordered: Current medicines are reviewed at length with the patient today.  Concerns regarding medicines are outlined above.  No orders of the  defined types were placed in this encounter.  No orders of the defined types were placed in this encounter.   There are no Patient Instructions on file for this visit.   Adopting a Healthy Lifestyle.  Know what a healthy weight is for you (roughly BMI <25) and aim to maintain this   Aim for 7+ servings of fruits and vegetables daily   65-80+ fluid ounces of water or unsweet tea for healthy kidneys   Limit to max 1 drink of alcohol per day; avoid smoking/tobacco   Limit animal fats in diet for cholesterol and heart health - choose grass fed whenever available   Avoid highly processed foods, and foods high in saturated/trans fats   Aim for low stress - take time to unwind and care for your mental health   Aim for 150 min of moderate intensity exercise weekly for heart health, and weights twice weekly for bone health   Aim for 7-9 hours of sleep daily   When it comes to diets, agreement about the perfect plan isnt easy to find, even among the experts. Experts at the [facility] [employer] developed an idea known as the Healthy Eating Plate. Just imagine a plate divided into logical, healthy portions.   The emphasis is on diet quality:   Load up on vegetables and fruits - one-half of your plate: Aim for color and variety, and remember that potatoes dont count.   Go for whole grains - one-quarter of your plate: Whole wheat, barley, wheat berries, quinoa, oats, brown rice, and foods made with them. If you want pasta, go with whole wheat pasta.   Protein power - one-quarter of your plate: Fish, chicken, beans, and nuts are all healthy, versatile protein sources. Limit red meat.   The diet, however, does go beyond the plate, offering a few other suggestions.   Use healthy plant oils, such as olive, canola, soy, corn, sunflower and peanut. Check the labels, and avoid partially hydrogenated oil, which have unhealthy trans fats.   If youre thirsty, drink water. Coffee and  tea are good in moderation, but skip sugary drinks and limit milk and dairy products to one or two daily servings.   The type of carbohydrate in the diet is more important than the amount. Some sources of carbohydrates, such as vegetables, fruits, whole grains, and beans-are healthier than others.   Finally, stay active  Signed, Thomasene Ripple, DO  08/06/2020 4:16 PM    [facility]

## 2020-08-06 NOTE — Addendum Note (Signed)
Addended by: Reynolds Bowl on: 08/06/2020 04:42 PM   Modules accepted: Orders

## 2020-08-09 ENCOUNTER — Ambulatory Visit: Payer: BLUE CROSS/BLUE SHIELD | Admitting: Cardiology

## 2020-08-12 ENCOUNTER — Telehealth: Payer: Self-pay | Admitting: Cardiology

## 2020-08-12 NOTE — Telephone Encounter (Signed)
Spoke to the patient just now and let her know that she should be fine to donate blood on the 21st. She verbalizes understanding and thanks me for calling back.    Encouraged patient to call back with any questions or concerns.

## 2020-08-12 NOTE — Telephone Encounter (Signed)
Patient had a heart cath on 07/26/20 and she would like to know if this will interfere with her donating blood. She states she plans to donate blood on 08/15/20. Please advise.

## 2020-08-13 ENCOUNTER — Other Ambulatory Visit: Payer: Self-pay

## 2020-08-13 ENCOUNTER — Ambulatory Visit
Admission: RE | Admit: 2020-08-13 | Discharge: 2020-08-13 | Disposition: A | Discharge status: Home / Self Care | Payer: Medicare Other | Source: Ambulatory Visit | Attending: Obstetrics and Gynecology | Admitting: Obstetrics and Gynecology

## 2020-08-13 DIAGNOSIS — Z1231 Encounter for screening mammogram for malignant neoplasm of breast: Secondary | ICD-10-CM

## 2020-10-01 DIAGNOSIS — I2510 Atherosclerotic heart disease of native coronary artery without angina pectoris: Secondary | ICD-10-CM | POA: Insufficient documentation

## 2020-10-24 ENCOUNTER — Ambulatory Visit: Payer: BLUE CROSS/BLUE SHIELD | Admitting: Cardiology

## 2021-06-27 ENCOUNTER — Other Ambulatory Visit: Payer: Self-pay | Admitting: Cardiology

## 2021-06-27 DIAGNOSIS — R0602 Shortness of breath: Secondary | ICD-10-CM

## 2021-06-27 DIAGNOSIS — R079 Chest pain, unspecified: Secondary | ICD-10-CM

## 2021-07-31 ENCOUNTER — Other Ambulatory Visit: Payer: Self-pay | Admitting: Cardiology

## 2021-10-18 ENCOUNTER — Other Ambulatory Visit: Payer: Self-pay | Admitting: Cardiology

## 2021-10-30 ENCOUNTER — Other Ambulatory Visit: Payer: Self-pay | Admitting: Cardiology

## 2021-10-30 NOTE — Telephone Encounter (Signed)
Refill to pharmacy 

## 2021-11-06 ENCOUNTER — Other Ambulatory Visit: Payer: Self-pay | Admitting: Cardiology

## 2021-11-27 NOTE — Progress Notes (Signed)
Cardiology Office Note:    Date:  11/28/2021   ID:  Charlene Jones, DOB 10/30/1950, MRN [id number]  PCP:  Charlene Clark, NP  Cardiologist:  Charlene Herrlich, MD    Referring MD: Charlene Jones*    ASSESSMENT:    1. Coronary artery disease involving native coronary artery of native heart without angina pectoris   2. Essential hypertension   3. Mixed hyperlipidemia    PLAN:    In order of problems listed above:  Charlene Jones is done well with CAD has no angina we will continue treatment including aspirin or statin and calcium channel blocker for hypertension and antianginal effect. Well-controlled asked her to start to trend record her blood pressure validated device good technique and bring to office visits in the future LDL at target continue statin   Next appointment: 1 year   Medication Adjustments/Labs and Tests Ordered: Current medicines are reviewed at length with the patient today.  Concerns regarding medicines are outlined above.  Orders Placed This Encounter  Procedures   EKG 12-Lead   No orders of the defined types were placed in this encounter.   Chief Complaint  Patient presents with   Medication Management    Need refill on all cardiac meds     History of Present Illness:    Charlene Jones is a 71 y.o. female with a hx of nonobstructive CAD hypertension and hyperlipidemia last seen 08/06/2020.  Compliance with diet, lifestyle and medications: Yes  Recent labs 07/11/2021 [facility]: Cholesterol 149 LDL 75 potassium 4.3 sodium 142 creatinine 0.63 bilirubin mildly elevated 1.4 otherwise normal liver function test    Overall she has done well, home blood pressure trends 120/80 and she has reduced her calcium channel blocker 50% because of intermittent lower extremity edema  She is quite active and has had no angina shortness of breath palpitation or syncope She tolerates her statin without muscle pain or weakness She does  have easy bruising of her arms.  2nd Diag lesion is 50% stenosed. Very small vessel. Mid LAD lesion is 25% stenosed. Mid RCA lesion is 10% stenosed. The left ventricular systolic function is normal. LV end diastolic pressure is normal. The left ventricular ejection fraction is 55-65% by visual estimate. There is no aortic valve stenosis.   Nonobstructive CAD. Continue medical therapy.   Exercise  The left ventricular ejection fraction is hyperdynamic (>65%). Nuclear stress EF: 77%. There was no ST segment deviation noted during stress. No T wave inversion was noted during stress. 6 beat run of nonsustained ventricular tachycardia at peak exercise. Exercise duration 8 minutes 16 seconds, achieving 92% of maximal predicted heart rate, at 10.1 METS. Blood pressure response was normal with exercise. Defect 1: There is a small reversible defect of mild severity present in the basal inferior location. Findings consistent with ischemia. This is an intermediate risk study, due to ischemia as well as nonsustained ventricular tachycardia during peak exercise.  Past Medical History:  Diagnosis Date   Abnormal EKG 06/24/2020   Abnormal nuclear stress test 07/23/2020   Bilateral leg edema 07/02/2020   Chest pain of uncertain etiology 07/02/2020   Essential hypertension 12/31/2017   Hyperlipemia, mixed 12/04/2015   Malaise and fatigue 12/04/2015   Mixed hyperlipidemia 07/02/2020   Numbness of left foot 10/24/2019   Oral thrush 03/13/2020   Osteopenia 12/31/2017   Formatting of this note might be different from the original. FRAX 14% and 1.2%  11/2017   Personal history of COVID-19 03/13/2020  Shortness of breath 07/02/2020    Past Surgical History:  Procedure Laterality Date   EXCISIONAL HEMORRHOIDECTOMY     with stapling   HAND SURGERY Left 11/29/2019   thumb, cmc joint resection orthroplasty, Dr. Donivan Scull @ [place] CATH AND CORONARY ANGIOGRAPHY N/A 07/26/2020   Procedure: LEFT HEART CATH AND  CORONARY ANGIOGRAPHY;  Surgeon: Jettie Booze, MD;  Location: [place] CV LAB;  Service: Cardiovascular;  Laterality: N/A;   WRIST SURGREY  2014    Current Medications: Current Meds  Medication Sig   amLODipine (NORVASC) 5 MG tablet TAKE 1 TABLET (5 MG TOTAL) BY MOUTH DAILY.   aspirin EC 81 MG tablet Take 1 tablet (81 mg total) by mouth daily. Swallow whole.   Cholecalciferol 50 MCG (2000 UT) CAPS Take 2,000 Units by mouth daily.   Multiple Vitamins-Minerals (MULTIVITAMIN WITH MINERALS) tablet Take 2 tablets by mouth daily.   nitroGLYCERIN (NITROSTAT) 0.4 MG SL tablet Place 1 tablet (0.4 mg total) under the tongue every 5 (five) minutes as needed for chest pain. Up to three times.   Omega-3 1000 MG CAPS Take 1,000 mg by mouth daily.   rosuvastatin (CRESTOR) 10 MG tablet Take 1 tablet (10 mg total) by mouth daily. Keep appointment on 11/28/21 for further refills, 3rd/final attempt     Allergies:   Cephalexin and Codeine   Social History   Socioeconomic History   Marital status: Married    Spouse name: Not on file   Number of children: Not on file   Years of education: Not on file   Highest education level: Not on file  Occupational History   Not on file  Tobacco Use   Smoking status: Never   Smokeless tobacco: Never  Substance and Sexual Activity   Alcohol use: Not Currently   Drug use: Never   Sexual activity: Not on file  Other Topics Concern   Not on file  Social History Narrative   Not on file   Social Determinants of Health   Financial Resource Strain: Not on file  Food Insecurity: Not on file  Transportation Needs: Not on file  Physical Activity: Not on file  Stress: Not on file  Social Connections: Not on file     Family History: The patient's family history includes Alzheimer's disease in her mother; Breast cancer in her paternal aunt; CAD in her father; Chorea in her mother; Colon cancer in her maternal grandmother; Dementia in her paternal  grandmother; Heart attack (age of onset: 24) in her father; Stomach cancer in her paternal grandfather; Stroke in her maternal grandfather. ROS:   Please see the history of present illness.    All other systems reviewed and are negative.  EKGs/Labs/Other Studies Reviewed:    The following studies were reviewed today:  EKG:  EKG ordered today and personally reviewed.  The ekg ordered today demonstrates sinus rhythm left axis deviation left anterior hemiblock otherwise normal EKG   Physical Exam:    VS:  BP (!) 136/90 (BP Location: Left Arm, Patient Position: Sitting)   Pulse 74   Ht 5\' 5"  (1.651 m)   Wt 148 lb 3.2 oz (67.2 kg)   SpO2 96%   BMI 24.66 kg/m     Wt Readings from Last 3 Encounters:  11/28/21 148 lb 3.2 oz (67.2 kg)  08/06/20 146 lb (66.2 kg)  07/26/20 144 lb (65.3 kg)     GEN:  Well nourished, well developed in no acute distress HEENT: Normal NECK:  No JVD; No carotid bruits LYMPHATICS: No lymphadenopathy CARDIAC: RRR, no murmurs, rubs, gallops RESPIRATORY:  Clear to auscultation without rales, wheezing or rhonchi  ABDOMEN: Soft, non-tender, non-distended MUSCULOSKELETAL:  No edema; No deformity  SKIN: Warm and dry NEUROLOGIC:  Alert and oriented x 3 PSYCHIATRIC:  Normal affect    Signed, Charlene Herrlich, MD  11/28/2021 10:59 AM    [facility]

## 2021-11-28 ENCOUNTER — Ambulatory Visit: Payer: Medicare Other | Attending: Cardiology | Admitting: Cardiology

## 2021-11-28 ENCOUNTER — Encounter: Payer: Self-pay | Admitting: Cardiology

## 2021-11-28 VITALS — BP 136/90 | HR 74 | Ht 65.0 in | Wt 148.2 lb

## 2021-11-28 DIAGNOSIS — I10 Essential (primary) hypertension: Secondary | ICD-10-CM | POA: Diagnosis present

## 2021-11-28 DIAGNOSIS — I2510 Atherosclerotic heart disease of native coronary artery without angina pectoris: Secondary | ICD-10-CM

## 2021-11-28 DIAGNOSIS — E782 Mixed hyperlipidemia: Secondary | ICD-10-CM

## 2021-11-28 NOTE — Patient Instructions (Addendum)
Medication Instructions:  Your physician recommends that you continue on your current medications as directed. Please refer to the Current Medication list given to you today.  *If you need a refill on your cardiac medications before your next appointment, please call your pharmacy*   Lab Work: NONE If you have labs (blood work) drawn today and your tests are completely normal, you will receive your results only by: MyChart Message (if you have MyChart) OR A paper copy in the mail If you have any lab test that is abnormal or we need to change your treatment, we will call you to review the results.   Testing/Procedures: NONE   Follow-Up: At [facility], you and your health needs are our priority.  As part of our continuing mission to provide you with exceptional heart care, we have created designated Provider Care Teams.  These Care Teams include your primary Cardiologist (physician) and Advanced Practice Providers (APPs -  Physician Assistants and Nurse Practitioners) who all work together to provide you with the care you need, when you need it.  We recommend signing up for the patient portal called "MyChart".  Sign up information is provided on this After Visit Summary.  MyChart is used to connect with patients for Virtual Visits (Telemedicine).  Patients are able to view lab/test results, encounter notes, upcoming appointments, etc.  Non-urgent messages can be sent to your provider as well.   To learn more about what you can do with MyChart, go to [email or web address].    Your next appointment:   1 year(s)  The format for your next appointment:   In Person  Provider:   Norman Herrlich, MD    Other Instructions Check and Record BP several times a week Exercise Goal: 6000-8500 Steps Daily  Important Information About Sugar      [employer]beat  Tips to measure your blood pressure correctly  To determine whether you have hypertension, a medical professional will take a  blood pressure reading. How you prepare for the test, the position of your arm, and other factors can change a blood pressure reading by 10% or more. That could be enough to hide high blood pressure, start you on a drug you don't really need, or lead your doctor to incorrectly adjust your medications. National and international guidelines offer specific instructions for measuring blood pressure. If a doctor, nurse, or medical assistant isn't doing it right, don't hesitate to ask him or her to get with the guidelines. Here's what you can do to ensure a correct reading:  Don't drink a caffeinated beverage or smoke during the 30 minutes before the test.  Sit quietly for five minutes before the test begins.  During the measurement, sit in a chair with your feet on the floor and your arm supported so your elbow is at about heart level.  The inflatable part of the cuff should completely cover at least 80% of your upper arm, and the cuff should be placed on bare skin, not over a shirt.  Don't talk during the measurement.  Have your blood pressure measured twice, with a brief break in between. If the readings are different by 5 points or more, have it done a third time. There are times to break these rules. If you sometimes feel lightheaded when getting out of bed in the morning or when you stand after sitting, you should have your blood pressure checked while seated and then while standing to see if it falls from one position to the next.  Because blood pressure varies throughout the day, your doctor will rarely diagnose hypertension on the basis of a single reading. Instead, he or she will want to confirm the measurements on at least two occasions, usually within a few weeks of one another. The exception to this rule is if you have a blood pressure reading of 180/110 mm Hg or higher. A result this high usually calls for prompt treatment. It's also a good idea to have your blood pressure measured in both arms at  least once, since the reading in one arm (usually the right) may be higher than that in the left. A 2014 study in The American Journal of Medicine of nearly 3,400 people found average arm- to-arm differences in systolic blood pressure of about 5 points. The higher number should be used to make treatment decisions. In 2017, new guidelines from the [place], the [employer] of Cardiology, and nine other health organizations lowered the diagnosis of high blood pressure to 130/80 mm Hg or higher for all adults. The guidelines also redefined the various blood pressure categories to now include normal, elevated, Stage 1 hypertension, Stage 2 hypertension, and hypertensive crisis (see "Blood pressure categories"). Blood pressure categories  Blood pressure category SYSTOLIC (upper number)  DIASTOLIC (lower number)  Normal Less than 120 mm Hg and Less than 80 mm Hg  Elevated 120-129 mm Hg and Less than 80 mm Hg  High blood pressure: Stage 1 hypertension 130-139 mm Hg or 80-89 mm Hg  High blood pressure: Stage 2 hypertension 140 mm Hg or higher or 90 mm Hg or higher  Hypertensive crisis (consult your doctor immediately) Higher than 180 mm Hg and/or Higher than 120 mm Hg  Source: American Heart Association and American Stroke Association. For more on getting your blood pressure under control, buy Controlling Your Blood Pressure, a Special Health Report from [facility].

## 2021-12-04 ENCOUNTER — Other Ambulatory Visit: Payer: Self-pay | Admitting: Obstetrics and Gynecology

## 2021-12-04 DIAGNOSIS — Z1231 Encounter for screening mammogram for malignant neoplasm of breast: Secondary | ICD-10-CM

## 2021-12-12 ENCOUNTER — Other Ambulatory Visit: Payer: Self-pay | Admitting: Cardiology

## 2021-12-12 NOTE — Telephone Encounter (Signed)
Refill sent to pharmacy.   

## 2021-12-30 ENCOUNTER — Ambulatory Visit
Admission: RE | Admit: 2021-12-30 | Discharge: 2021-12-30 | Disposition: A | Discharge status: Home / Self Care | Payer: Medicare Other | Source: Ambulatory Visit | Attending: Obstetrics and Gynecology | Admitting: Obstetrics and Gynecology

## 2021-12-30 DIAGNOSIS — Z1231 Encounter for screening mammogram for malignant neoplasm of breast: Secondary | ICD-10-CM

## 2022-04-29 ENCOUNTER — Other Ambulatory Visit: Payer: Self-pay | Admitting: Cardiology

## 2022-07-27 ENCOUNTER — Other Ambulatory Visit: Payer: Self-pay | Admitting: Cardiology

## 2022-10-29 ENCOUNTER — Other Ambulatory Visit: Payer: Self-pay | Admitting: Cardiology

## 2022-11-24 ENCOUNTER — Other Ambulatory Visit: Payer: Self-pay | Admitting: Obstetrics and Gynecology

## 2022-11-24 DIAGNOSIS — Z1231 Encounter for screening mammogram for malignant neoplasm of breast: Secondary | ICD-10-CM

## 2022-11-28 ENCOUNTER — Other Ambulatory Visit: Payer: Self-pay | Admitting: Cardiology

## 2022-12-29 ENCOUNTER — Other Ambulatory Visit: Payer: Self-pay | Admitting: Cardiology

## 2023-01-02 ENCOUNTER — Other Ambulatory Visit: Payer: Self-pay | Admitting: Cardiology

## 2023-01-05 ENCOUNTER — Ambulatory Visit
Admission: RE | Admit: 2023-01-05 | Discharge: 2023-01-05 | Disposition: A | Discharge status: Home / Self Care | Payer: Medicare Other | Source: Ambulatory Visit | Attending: Obstetrics and Gynecology | Admitting: Obstetrics and Gynecology

## 2023-01-05 DIAGNOSIS — Z1231 Encounter for screening mammogram for malignant neoplasm of breast: Secondary | ICD-10-CM

## 2023-01-07 NOTE — Progress Notes (Signed)
Cardiology Office Note:  .   Date:  01/08/2023  ID:  Willaim Bane, DOB 1951/01/06, MRN [id number] PCP: Krystal Clark, NP  [facility] Providers Cardiologist:  Norman Herrlich, MD    History of Present Illness: .   Charlene Jones is a 72 y.o. female with a past medical history of HTN, CAD, hyperlipidemia.  07/26/2020 left heart cath non-obstructive CAD 07/17/2020 lexiscan small reversible defect consistent with ischemia >> LHC  Last evaluated by Dr. Dulce Sellar on 11/28/2021, was doing well from a cardiac perspective, advised to follow up in 1 year.   She presents today for follow-up of her CAD, offers no formal complaints.  She does question whether she needs to take all the medication she is currently on, she questions if some of them are causing her fatigue.  She does have some pedal edema, her PCP adjusted her amlodipine however the edema has persisted.  She exercises regularly, continues to work. She denies chest pain, palpitations, dyspnea, pnd, orthopnea, n, v, dizziness, syncope, weight gain, or early satiety.    ROS: Review of Systems  Constitutional:  Positive for malaise/fatigue.  Cardiovascular:  Positive for leg swelling.  Endo/Heme/Allergies:  Bruises/bleeds easily.  All other systems reviewed and are negative.    Studies Reviewed: Marland Kitchen   EKG Interpretation Date/Time:  Friday January 08 2023 15:57:09 EST Ventricular Rate:  72 PR Interval:  166 QRS Duration:  76 QT Interval:  400 QTC Calculation: 438 R Axis:   -38  Text Interpretation: Normal sinus rhythm with sinus arrhythmia Possible Left atrial enlargement Left axis deviation Abnormal ECG When compared with ECG of 07-Dec-2005 11:04, QRS axis Shifted left Confirmed by Wallis Bamberg [phone]) on 01/08/2023 4:01:26 PM    Cardiac Studies & Procedures   CARDIAC CATHETERIZATION  CARDIAC CATHETERIZATION 07/26/2020  Narrative  2nd Diag lesion is 50% stenosed. Very small vessel.  Mid LAD lesion is 25%  stenosed.  Mid RCA lesion is 10% stenosed.  The left ventricular systolic function is normal.  LV end diastolic pressure is normal.  The left ventricular ejection fraction is 55-65% by visual estimate.  There is no aortic valve stenosis.  Nonobstructive CAD. Continue medical therapy.  Right radial spasm.  It may be difficult to get a 6 Fr guide catheter through the radial if needed in the future.  Findings Coronary Findings Diagnostic  Dominance: Right  Left Anterior Descending Mid LAD lesion is 25% stenosed.  Second Diagonal Branch 2nd Diag lesion is 50% stenosed.  Right Coronary Artery Mid RCA lesion is 10% stenosed.  Intervention  No interventions have been documented.   STRESS TESTS  MYOCARDIAL PERFUSION IMAGING 07/17/2020  Narrative  The left ventricular ejection fraction is hyperdynamic (>65%).  Nuclear stress EF: 77%.  There was no ST segment deviation noted during stress.  No T wave inversion was noted during stress.  6 beat run of nonsustained ventricular tachycardia at peak exercise.  Exercise duration 8 minutes 16 seconds, achieving 92% of maximal predicted heart rate, at 10.1 METS. Blood pressure response was normal with exercise.  Defect 1: There is a small reversible defect of mild severity present in the basal inferior location.  Findings consistent with ischemia.  This is an intermediate risk study, due to ischemia as well as nonsustained ventricular tachycardia during peak exercise.              Risk Assessment/Calculations:      Physical Exam:   VS:  BP (!) 136/91 (BP Location: Left Arm,  Patient Position: Sitting, Cuff Size: Normal)   Pulse 72   Ht 5\' 5"  (1.651 m)   Wt 143 lb (64.9 kg)   SpO2 98%   BMI 23.80 kg/m    Wt Readings from Last 3 Encounters:  01/08/23 143 lb (64.9 kg)  11/28/21 148 lb 3.2 oz (67.2 kg)  08/06/20 146 lb (66.2 kg)    GEN: Well nourished, well developed in no acute distress NECK: No JVD; No carotid  bruits CARDIAC: RRR, no murmurs, rubs, gallops RESPIRATORY:  Clear to auscultation without rales, wheezing or rhonchi  ABDOMEN: Soft, non-tender, non-distended EXTREMITIES: +1 pedal edema; No deformity   ASSESSMENT AND PLAN: .   CAD -nonobstructive per left heart cath in 2022, Stable with no anginal symptoms. No indication for ischemic evaluation.  Continue aspirin 81 mg daily, continue Crestor 10 mg daily, continue nitroglycerin as needed. Heart healthy diet and regular cardiovascular exercise encouraged.    Dyslipidemia-most recent LDL was slightly elevated 86, prefer this to be less than 70 however she is already hesitant about taking her statin.  For now, continue to work on lifestyle modifications.  HTN - BP is slightly elevated at 136/91 however she did not take her blood pressure medicine today.  She is interested in coming off of her blood pressure medicine.  She states it is typically lower at home.  I will have her keep a blood pressure log for 2 weeks before we make any recommendations to her medication regimen.         Dispo: Blood pressure log for 2 weeks, follow-up in 1 year.  Signed, Flossie Dibble, NP

## 2023-01-08 ENCOUNTER — Ambulatory Visit: Payer: Medicare Other | Attending: Cardiology | Admitting: Cardiology

## 2023-01-08 ENCOUNTER — Encounter: Payer: Self-pay | Admitting: Cardiology

## 2023-01-08 VITALS — BP 136/91 | HR 72 | Ht 65.0 in | Wt 143.0 lb

## 2023-01-08 DIAGNOSIS — I2510 Atherosclerotic heart disease of native coronary artery without angina pectoris: Secondary | ICD-10-CM

## 2023-01-08 DIAGNOSIS — E785 Hyperlipidemia, unspecified: Secondary | ICD-10-CM

## 2023-01-08 DIAGNOSIS — I10 Essential (primary) hypertension: Secondary | ICD-10-CM | POA: Diagnosis present

## 2023-01-08 MED ORDER — ROSUVASTATIN CALCIUM 10 MG PO TABS: 10.0000 mg | ORAL_TABLET | Freq: Every day | ORAL | 3 refills | Status: DC

## 2023-01-08 NOTE — Addendum Note (Signed)
Addended by: Flossie Dibble on: 01/08/2023 04:28 PM   Modules accepted: Orders

## 2023-01-08 NOTE — Patient Instructions (Signed)
Medication Instructions:  Your physician recommends that you continue on your current medications as directed. Please refer to the Current Medication list given to you today.  *If you need a refill on your cardiac medications before your next appointment, please call your pharmacy*   Lab Work: NONE If you have labs (blood work) drawn today and your tests are completely normal, you will receive your results only by: MyChart Message (if you have MyChart) OR A paper copy in the mail If you have any lab test that is abnormal or we need to change your treatment, we will call you to review the results.   Testing/Procedures: NONE   Follow-Up: At [facility], you and your health needs are our priority.  As part of our continuing mission to provide you with exceptional heart care, we have created designated Provider Care Teams.  These Care Teams include your primary Cardiologist (physician) and Advanced Practice Providers (APPs -  Physician Assistants and Nurse Practitioners) who all work together to provide you with the care you need, when you need it.  We recommend signing up for the patient portal called "MyChart".  Sign up information is provided on this After Visit Summary.  MyChart is used to connect with patients for Virtual Visits (Telemedicine).  Patients are able to view lab/test results, encounter notes, upcoming appointments, etc.  Non-urgent messages can be sent to your provider as well.   To learn more about what you can do with MyChart, go to [email or web address].    Your next appointment:   1 year(s)  Provider:   Norman Herrlich, MD    Other Instructions Check and Record BP X2 Weeks and bring log by office

## 2023-01-28 ENCOUNTER — Other Ambulatory Visit: Payer: Self-pay | Admitting: Cardiology

## 2023-03-01 ENCOUNTER — Ambulatory Visit (HOSPITAL_BASED_OUTPATIENT_CLINIC_OR_DEPARTMENT_OTHER)
Admission: EM | Admit: 2023-03-01 | Discharge: 2023-03-01 | Disposition: A | Discharge status: Home / Self Care | Payer: Medicare Other | Attending: Family Medicine | Admitting: Family Medicine

## 2023-03-01 ENCOUNTER — Encounter (HOSPITAL_BASED_OUTPATIENT_CLINIC_OR_DEPARTMENT_OTHER): Payer: Self-pay | Admitting: Emergency Medicine

## 2023-03-01 DIAGNOSIS — Z87440 Personal history of urinary (tract) infections: Secondary | ICD-10-CM | POA: Diagnosis not present

## 2023-03-01 DIAGNOSIS — R52 Pain, unspecified: Secondary | ICD-10-CM

## 2023-03-01 DIAGNOSIS — J101 Influenza due to other identified influenza virus with other respiratory manifestations: Secondary | ICD-10-CM | POA: Diagnosis not present

## 2023-03-01 DIAGNOSIS — R102 Pelvic and perineal pain: Secondary | ICD-10-CM

## 2023-03-01 DIAGNOSIS — R509 Fever, unspecified: Secondary | ICD-10-CM | POA: Diagnosis not present

## 2023-03-01 DIAGNOSIS — R0981 Nasal congestion: Secondary | ICD-10-CM | POA: Diagnosis present

## 2023-03-01 DIAGNOSIS — K5900 Constipation, unspecified: Secondary | ICD-10-CM | POA: Insufficient documentation

## 2023-03-01 LAB — POCT URINALYSIS DIP (MANUAL ENTRY)
Bilirubin, UA: NEGATIVE
Blood, UA: NEGATIVE
Glucose, UA: NEGATIVE mg/dL
Leukocytes, UA: NEGATIVE
Nitrite, UA: NEGATIVE
Spec Grav, UA: 1.025
Urobilinogen, UA: 0.2 U/dL
pH, UA: 6.5

## 2023-03-01 MED ORDER — OSELTAMIVIR PHOSPHATE 75 MG PO CAPS: 75.0000 mg | ORAL_CAPSULE | Freq: Two times a day (BID) | ORAL | 0 refills | Status: AC

## 2023-03-01 NOTE — ED Provider Notes (Signed)
[facility]    CSN: [id number] Arrival date & time: 03/01/23  1050      History   Chief Complaint Chief Complaint  Patient presents with   Fever    HPI Charlene Jones is a 73 y.o. female.   Patient reports she started feeling bad yesterday with just mild congestion.  During the night she had a fever as high as 101.0.  Today she has had fever again and also bodyaches.  She just generally does not feel good and thinks she may have the flu or could even have a UTI.  She has had UTIs in the past with little or no symptoms.  Currently she denies burning or frequency of urination.  She denies nausea, vomiting, diarrhea.  She is having some mild constipation.   Fever Associated symptoms: congestion   Associated symptoms: no chest pain, no chills, no cough, no diarrhea, no dysuria, no ear pain, no nausea, no rash, no sore throat and no vomiting     Past Medical History:  Diagnosis Date   Abnormal EKG 06/24/2020   Abnormal nuclear stress test 07/23/2020   Bilateral leg edema 07/02/2020   Chest pain of uncertain etiology 07/02/2020   Essential hypertension 12/31/2017   Hyperlipemia, mixed 12/04/2015   Malaise and fatigue 12/04/2015   Mixed hyperlipidemia 07/02/2020   Numbness of left foot 10/24/2019   Oral thrush 03/13/2020   Osteopenia 12/31/2017   Formatting of this note might be different from the original. FRAX 14% and 1.2%  11/2017   Personal history of COVID-19 03/13/2020   Shortness of breath 07/02/2020    Patient Active Problem List   Diagnosis Date Noted   Coronary artery disease involving native coronary artery of native heart 10/01/2020   Abnormal nuclear stress test 07/23/2020   Shortness of breath 07/02/2020   Chest pain of uncertain etiology 07/02/2020   Bilateral leg edema 07/02/2020   Mixed hyperlipidemia 07/02/2020   Abnormal EKG 06/24/2020   Oral thrush 03/13/2020   Personal history of COVID-19 03/13/2020   Numbness of left foot 10/24/2019   Essential  hypertension 12/31/2017   Osteopenia 12/31/2017   Hyperlipemia, mixed 12/04/2015   Malaise and fatigue 12/04/2015    Past Surgical History:  Procedure Laterality Date   EXCISIONAL HEMORRHOIDECTOMY     with stapling   HAND SURGERY Left 11/29/2019   thumb, cmc joint resection orthroplasty, Dr. Loralie Champagne @ [facility]   LEFT HEART CATH AND CORONARY ANGIOGRAPHY N/A 07/26/2020   Procedure: LEFT HEART CATH AND CORONARY ANGIOGRAPHY;  Surgeon: Corky Crafts, MD;  Location: [facility] INVASIVE CV LAB;  Service: Cardiovascular;  Laterality: N/A;   WRIST SURGREY  2014    OB History   No obstetric history on file.      Home Medications    Prior to Admission medications   Medication Sig Start Date End Date Taking? Authorizing Provider  amLODipine (NORVASC) 2.5 MG tablet Take 2.5 mg by mouth daily.   Yes [provider]  aspirin EC 81 MG tablet Take 1 tablet (81 mg total) by mouth daily. Swallow whole. 07/23/20  Yes Tobb, Kardie, DO  Cholecalciferol 50 MCG (2000 UT) CAPS Take 2,000 Units by mouth daily.   Yes [provider]  Multiple Vitamins-Minerals (MULTIVITAMIN WITH MINERALS) tablet Take 2 tablets by mouth daily.   Yes [provider]  Omega-3 1000 MG CAPS Take 1,000 mg by mouth daily.   Yes [provider]  oseltamivir (TAMIFLU) 75 MG capsule Take 1 capsule (75  mg total) by mouth every 12 (twelve) hours. 03/01/23  Yes Prescilla Sours, FNP  rosuvastatin (CRESTOR) 10 MG tablet Take 1 tablet (10 mg total) by mouth daily. 01/08/23  Yes Flossie Dibble, NP  nitroGLYCERIN (NITROSTAT) 0.4 MG SL tablet Place 1 tablet (0.4 mg total) under the tongue every 5 (five) minutes as needed for chest pain. Up to three times. 08/06/20 01/08/23  Thomasene Ripple, DO    Family History Family History  Problem Relation Age of Onset   Alzheimer's disease Mother    Chorea Mother    Heart attack Father 41   CAD Father    Colon cancer Maternal Grandmother    Stroke Maternal Grandfather     Dementia Paternal Grandmother    Stomach cancer Paternal Grandfather    Breast cancer Paternal Aunt     Social History Social History   Tobacco Use   Smoking status: Never   Smokeless tobacco: Never  Substance Use Topics   Alcohol use: Not Currently   Drug use: Never     Allergies   Cephalexin and Codeine   Review of Systems Review of Systems  Constitutional:  Positive for fever. Negative for chills.  HENT:  Positive for congestion. Negative for ear pain and sore throat.   Eyes:  Negative for pain and visual disturbance.  Respiratory:  Negative for cough and shortness of breath.   Cardiovascular:  Negative for chest pain and palpitations.  Gastrointestinal:  Positive for constipation. Negative for abdominal pain, diarrhea, nausea and vomiting.  Genitourinary:  Negative for dysuria and hematuria.  Musculoskeletal:  Negative for arthralgias and back pain.  Skin:  Negative for color change and rash.  Neurological:  Negative for seizures and syncope.  All other systems reviewed and are negative.    Physical Exam Triage Vital Signs ED Triage Vitals  Encounter Vitals Group     BP 03/01/23 1115 (!) 156/95     Systolic BP Percentile --      Diastolic BP Percentile --      Pulse Rate 03/01/23 1115 (!) 105     Resp 03/01/23 1115 20     Temp 03/01/23 1115 98.8 F (37.1 C)     Temp Source 03/01/23 1115 Oral     SpO2 03/01/23 1115 96 %     Weight --      Height --      Head Circumference --      Peak Flow --      Pain Score 03/01/23 1114 0     Pain Loc --      Pain Education --      Exclude from Growth Chart --    No data found.  Updated Vital Signs BP (!) 156/95 (BP Location: Left Arm)   Pulse (!) 105   Temp (!) 101.4 F (38.6 C) (Oral)   Resp 20   SpO2 96%   Visual Acuity Right Eye Distance:   Left Eye Distance:   Bilateral Distance:    Right Eye Near:   Left Eye Near:    Bilateral Near:     Physical Exam Vitals and nursing note reviewed.   Constitutional:      General: She is not in acute distress.    Appearance: She is well-developed. She is ill-appearing and toxic-appearing.  HENT:     Head: Normocephalic and atraumatic.     Right Ear: Hearing, tympanic membrane, ear canal and external ear normal.     Left Ear: Hearing, tympanic membrane, ear canal  and external ear normal.     Nose: Congestion and rhinorrhea present. Rhinorrhea is clear.     Right Sinus: No maxillary sinus tenderness or frontal sinus tenderness.     Left Sinus: No maxillary sinus tenderness or frontal sinus tenderness.     Mouth/Throat:     Lips: Pink.     Mouth: Mucous membranes are moist.     Pharynx: Uvula midline. No oropharyngeal exudate or posterior oropharyngeal erythema.     Tonsils: No tonsillar exudate.  Eyes:     Conjunctiva/sclera: Conjunctivae normal.     Pupils: Pupils are equal, round, and reactive to light.  Cardiovascular:     Rate and Rhythm: Normal rate and regular rhythm.     Heart sounds: S1 normal and S2 normal. No murmur heard. Pulmonary:     Effort: Pulmonary effort is normal. No respiratory distress.     Breath sounds: Normal breath sounds. No decreased breath sounds, wheezing, rhonchi or rales.  Abdominal:     General: Bowel sounds are normal.     Palpations: Abdomen is soft.     Tenderness: There is abdominal tenderness (Mild) in the suprapubic area.  Musculoskeletal:        General: No swelling.     Cervical back: Neck supple.  Lymphadenopathy:     Head:     Right side of head: No submental, submandibular, tonsillar, preauricular or posterior auricular adenopathy.     Left side of head: No submental, submandibular, tonsillar, preauricular or posterior auricular adenopathy.     Cervical: No cervical adenopathy.     Right cervical: No superficial cervical adenopathy.    Left cervical: No superficial cervical adenopathy.  Skin:    General: Skin is warm and dry.     Capillary Refill: Capillary refill takes less than 2  seconds.     Findings: No rash.  Neurological:     Mental Status: She is alert and oriented to person, place, and time.  Psychiatric:        Mood and Affect: Mood normal.      UC Treatments / Results  Labs (all labs ordered are listed, but only abnormal results are displayed) Labs Reviewed  POCT URINALYSIS DIP (MANUAL ENTRY) - Abnormal; Notable for the following components:      Result Value   Ketones, POC UA moderate (40) (*)    Protein Ur, POC trace (*)    All other components within normal limits  URINE CULTURE    EKG   Radiology No results found.  Procedures Procedures (including critical care time)  Medications Ordered in UC Medications - No data to display  Initial Impression / Assessment and Plan / UC Course  I have reviewed the triage vital signs and the nursing notes.  Pertinent labs & imaging results that were available during my care of the patient were reviewed by me and considered in my medical decision making (see chart for details).  Influenza testing not available.  History is most consistent with influenza type A.  Will treat presumptively with Tamiflu, 75 mg, twice daily for 5 days.  Get plenty of fluids and rest.  Declined prescription cough syrup.  She will use OTC Delsym or other decongestants if needed.  History of UTIs with little or no symptoms.  Mild suprapubic pain on exam.  UA is essentially normal.  Urine culture sent.  Will adjust the plan of care, if needed once the culture results.  Follow-up if symptoms do not improve, worsen or new  symptoms occur.  Final Clinical Impressions(s) / UC Diagnoses   Final diagnoses:  Fever, unspecified  History of UTI  Body aches  Type A influenza  Acute suprapubic pain     Discharge Instructions      Patient has had fever, body aches and headaches.  Exam and history are most consistent with influenza type A.  Flu testing not available at this time.  Will treat for influenza type A.  Tamiflu, 75  mg, twice daily for 5 days.  Get plenty of fluids and rest.  May use OTC decongestants or cough syrup such as Delsym, as needed.  History of UTIs with little or no symptoms.  UA is negative for signs of infection.  Urine culture sent.  Will adjust the plan of care, as needed if the culture is positive.  Follow-up if symptoms do not improve, worsen or new symptoms occur.     ED Prescriptions     Medication Sig Dispense Auth. Provider   oseltamivir (TAMIFLU) 75 MG capsule Take 1 capsule (75 mg total) by mouth every 12 (twelve) hours. 10 capsule Prescilla Sours, FNP      PDMP not reviewed this encounter.   Prescilla Sours, FNP 03/01/23 1141

## 2023-03-01 NOTE — Discharge Instructions (Addendum)
Patient has had fever, body aches and headaches.  Exam and history are most consistent with influenza type A.  Flu testing not available at this time.  Will treat for influenza type A.  Tamiflu, 75 mg, twice daily for 5 days.  Get plenty of fluids and rest.  May use OTC decongestants or cough syrup such as Delsym, as needed.  History of UTIs with little or no symptoms.  UA is negative for signs of infection.  Urine culture sent.  Will adjust the plan of care, as needed if the culture is positive.  Follow-up if symptoms do not improve, worsen or new symptoms occur.

## 2023-03-01 NOTE — ED Triage Notes (Signed)
Pt c/o fever of 101, doesn't feel good started this morning.

## 2023-03-03 LAB — URINE CULTURE: Culture: NO GROWTH

## 2023-03-25 ENCOUNTER — Ambulatory Visit (HOSPITAL_BASED_OUTPATIENT_CLINIC_OR_DEPARTMENT_OTHER)
Admission: EM | Admit: 2023-03-25 | Discharge: 2023-03-25 | Disposition: A | Discharge status: Home / Self Care | Payer: Medicare Other | Attending: Family Medicine | Admitting: Family Medicine

## 2023-03-25 ENCOUNTER — Ambulatory Visit (INDEPENDENT_AMBULATORY_CARE_PROVIDER_SITE_OTHER)
Admit: 2023-03-25 | Discharge: 2023-03-25 | Disposition: A | Discharge status: Home / Self Care | Payer: Medicare Other | Attending: Family Medicine | Admitting: Family Medicine

## 2023-03-25 ENCOUNTER — Encounter (HOSPITAL_BASED_OUTPATIENT_CLINIC_OR_DEPARTMENT_OTHER): Payer: Self-pay | Admitting: Emergency Medicine

## 2023-03-25 DIAGNOSIS — R071 Chest pain on breathing: Secondary | ICD-10-CM

## 2023-03-25 DIAGNOSIS — R059 Cough, unspecified: Secondary | ICD-10-CM | POA: Diagnosis not present

## 2023-03-25 DIAGNOSIS — R051 Acute cough: Secondary | ICD-10-CM | POA: Diagnosis not present

## 2023-03-25 DIAGNOSIS — R079 Chest pain, unspecified: Secondary | ICD-10-CM | POA: Diagnosis not present

## 2023-03-25 NOTE — ED Provider Notes (Signed)
 [facility]    CSN: [id number] Arrival date & time: 03/25/23  1645      History   Chief Complaint Chief Complaint  Patient presents with   chest soreness   back pain    HPI Charlene Jones is a 73 y.o. female.   Patient reports that she was working and at her desk and had a sharp pain between her shoulder blades and it kind of went to her left scapula.  And she had that pain, twisting and moving.  And then since that time she has had a central sternal chest pain whenever she takes a deep breath.  She is concerned that this might be cardiac chest pain or pneumonia.  She was seen here at this urgent care on 03/01/2023 for influenza type A.  She was treated with Tamiflu.     Past Medical History:  Diagnosis Date   Abnormal EKG 06/24/2020   Abnormal nuclear stress test 07/23/2020   Bilateral leg edema 07/02/2020   Chest pain of uncertain etiology 07/02/2020   Essential hypertension 12/31/2017   Hyperlipemia, mixed 12/04/2015   Malaise and fatigue 12/04/2015   Mixed hyperlipidemia 07/02/2020   Numbness of left foot 10/24/2019   Oral thrush 03/13/2020   Osteopenia 12/31/2017   Formatting of this note might be different from the original. FRAX 14% and 1.2%  11/2017   Personal history of COVID-19 03/13/2020   Shortness of breath 07/02/2020    Patient Active Problem List   Diagnosis Date Noted   Coronary artery disease involving native coronary artery of native heart 10/01/2020   Abnormal nuclear stress test 07/23/2020   Shortness of breath 07/02/2020   Chest pain of uncertain etiology 07/02/2020   Bilateral leg edema 07/02/2020   Mixed hyperlipidemia 07/02/2020   Abnormal EKG 06/24/2020   Oral thrush 03/13/2020   Personal history of COVID-19 03/13/2020   Numbness of left foot 10/24/2019   Essential hypertension 12/31/2017   Osteopenia 12/31/2017   Hyperlipemia, mixed 12/04/2015   Malaise and fatigue 12/04/2015    Past Surgical History:  Procedure Laterality Date    EXCISIONAL HEMORRHOIDECTOMY     with stapling   HAND SURGERY Left 11/29/2019   thumb, cmc joint resection orthroplasty, Dr. Loralie Champagne @ [facility]   LEFT HEART CATH AND CORONARY ANGIOGRAPHY N/A 07/26/2020   Procedure: LEFT HEART CATH AND CORONARY ANGIOGRAPHY;  Surgeon: Corky Crafts, MD;  Location: [facility];  Service: Cardiovascular;  Laterality: N/A;   WRIST SURGREY  2014    OB History   No obstetric history on file.      Home Medications    Prior to Admission medications   Medication Sig Start Date End Date Taking? Authorizing Provider  rosuvastatin (CRESTOR) 10 MG tablet Take 1 tablet (10 mg total) by mouth daily. 01/08/23  Yes Flossie Dibble, NP  amLODipine (NORVASC) 2.5 MG tablet Take 2.5 mg by mouth daily.    [provider]  aspirin EC 81 MG tablet Take 1 tablet (81 mg total) by mouth daily. Swallow whole. 07/23/20   Tobb, Lavona Mound, DO  Cholecalciferol 50 MCG (2000 UT) CAPS Take 2,000 Units by mouth daily.    [provider]  Multiple Vitamins-Minerals (MULTIVITAMIN WITH MINERALS) tablet Take 2 tablets by mouth daily.    [provider]  nitroGLYCERIN (NITROSTAT) 0.4 MG SL tablet Place 1 tablet (0.4 mg total) under the tongue every 5 (five) minutes as needed for chest pain. Up to three times. 08/06/20 01/08/23  Tobb, Kardie, DO  Omega-3 1000 MG CAPS Take 1,000 mg by mouth daily.    [provider]  oseltamivir (TAMIFLU) 75 MG capsule Take 1 capsule (75 mg total) by mouth every 12 (twelve) hours. 03/01/23   Prescilla Sours, FNP    Family History Family History  Problem Relation Age of Onset   Alzheimer's disease Mother    Chorea Mother    Heart attack Father 21   CAD Father    Colon cancer Maternal Grandmother    Stroke Maternal Grandfather    Dementia Paternal Grandmother    Stomach cancer Paternal Grandfather    Breast cancer Paternal Aunt     Social History Social History   Tobacco Use   Smoking status: Never   Smokeless  tobacco: Never  Substance Use Topics   Alcohol use: Not Currently   Drug use: Never     Allergies   Cephalexin and Codeine   Review of Systems Review of Systems  Constitutional:  Negative for chills and fever.  HENT:  Negative for ear pain and sore throat.   Eyes:  Negative for pain and visual disturbance.  Respiratory:  Positive for cough and chest tightness. Negative for shortness of breath.   Cardiovascular:  Positive for chest pain. Negative for palpitations.  Gastrointestinal:  Negative for abdominal pain, constipation, diarrhea, nausea and vomiting.  Genitourinary:  Negative for dysuria and hematuria.  Musculoskeletal:  Positive for myalgias (between shoulder blades and some muscle tenderness in anterior chest wall.). Negative for arthralgias and back pain.  Skin:  Negative for color change and rash.  Neurological:  Negative for seizures and syncope.  All other systems reviewed and are negative.    Physical Exam Triage Vital Signs ED Triage Vitals  Encounter Vitals Group     BP 03/25/23 1705 (!) 174/90     Systolic BP Percentile --      Diastolic BP Percentile --      Pulse Rate 03/25/23 1705 80     Resp 03/25/23 1705 18     Temp 03/25/23 1705 97.6 F (36.4 C)     Temp Source 03/25/23 1705 Oral     SpO2 03/25/23 1705 96 %     Weight --      Height --      Head Circumference --      Peak Flow --      Pain Score 03/25/23 1703 0     Pain Loc --      Pain Education --      Exclude from Growth Chart --    No data found.  Updated Vital Signs BP (!) 174/90 (BP Location: Right Arm)   Pulse 80   Temp 97.6 F (36.4 C) (Oral)   Resp 18   SpO2 96%   Visual Acuity Right Eye Distance:   Left Eye Distance:   Bilateral Distance:    Right Eye Near:   Left Eye Near:    Bilateral Near:     Physical Exam Vitals and nursing note reviewed.  Constitutional:      General: She is not in acute distress.    Appearance: She is well-developed. She is not ill-appearing  or toxic-appearing.  HENT:     Head: Normocephalic and atraumatic.     Right Ear: Hearing normal. There is impacted cerumen (Moderate to severe, canal is completely occluded.  TM is not visible.).     Left Ear: Hearing, tympanic membrane, ear canal and external ear normal.  Nose: No congestion or rhinorrhea.     Right Sinus: No maxillary sinus tenderness or frontal sinus tenderness.     Left Sinus: No maxillary sinus tenderness or frontal sinus tenderness.     Mouth/Throat:     Lips: Pink.     Mouth: Mucous membranes are moist.     Pharynx: Uvula midline. No oropharyngeal exudate or posterior oropharyngeal erythema.     Tonsils: No tonsillar exudate.  Eyes:     Conjunctiva/sclera: Conjunctivae normal.     Pupils: Pupils are equal, round, and reactive to light.  Cardiovascular:     Rate and Rhythm: Normal rate and regular rhythm.     Heart sounds: S1 normal and S2 normal. No murmur heard. Pulmonary:     Effort: Pulmonary effort is normal. No respiratory distress.     Breath sounds: Examination of the right-lower field reveals decreased breath sounds. Examination of the left-lower field reveals decreased breath sounds. Decreased breath sounds present. No wheezing, rhonchi or rales.  Abdominal:     General: Bowel sounds are normal.     Palpations: Abdomen is soft.     Tenderness: There is no abdominal tenderness.  Musculoskeletal:        General: No swelling.     Cervical back: Neck supple.  Lymphadenopathy:     Head:     Right side of head: No submental, submandibular, tonsillar, preauricular or posterior auricular adenopathy.     Left side of head: No submental, submandibular, tonsillar, preauricular or posterior auricular adenopathy.     Cervical: No cervical adenopathy.     Right cervical: No superficial cervical adenopathy.    Left cervical: No superficial cervical adenopathy.  Skin:    General: Skin is warm and dry.     Capillary Refill: Capillary refill takes less than 2  seconds.     Findings: No rash.  Neurological:     Mental Status: She is alert and oriented to person, place, and time.  Psychiatric:        Mood and Affect: Mood normal.      UC Treatments / Results  Labs (all labs ordered are listed, but only abnormal results are displayed) Labs Reviewed - No data to display  EKG:  Shows NSR with a left Axis Deviation.  HR is normal at 77.  No ectopy.   Radiology DG Chest 2 View Result Date: 03/25/2023 CLINICAL DATA:  Cough and chest pain with respirations. Pain between the back shoulder blades. Soreness in the chest. EXAM: CHEST - 2 VIEW COMPARISON:  None Available. FINDINGS: The heart size and mediastinal contours are within normal limits. Both lungs are clear. The visualized skeletal structures are unremarkable. IMPRESSION: No active cardiopulmonary disease. Electronically Signed   By: Burman Nieves M.D.   On: 03/25/2023 20:24    Procedures Procedures (including critical care time)  Medications Ordered in UC Medications - No data to display  Initial Impression / Assessment and Plan / UC Course  I have reviewed the triage vital signs and the nursing notes.  Pertinent labs & imaging results that were available during my care of the patient were reviewed by me and considered in my medical decision making (see chart for details).     ECG shows normal sinus rhythm.  Chest x-ray was negative.  Encouraged to take acetaminophen or ibuprofen, if having any chest wall pain.  Follow-up if symptoms do not improve, worsen or new symptoms occur. Final Clinical Impressions(s) / UC Diagnoses   Final diagnoses:  Chest pain, unspecified type  Acute cough  Chest pain with painful respiration     Discharge Instructions      ECG has shows normal sinus rhythm.  And chest x-ray is negative.  Some acetaminophen or ibuprofen if you are having chest wall pain.  Follow-up if symptoms do not improve, worsen or new symptoms occur.     ED  Prescriptions   None    PDMP not reviewed this encounter.   Prescilla Sours, FNP 03/25/23 2056

## 2023-03-25 NOTE — ED Triage Notes (Signed)
 Pt reports pain between her back shoulder blades this morning, pt has soreness to her chest.

## 2023-03-25 NOTE — Progress Notes (Signed)
 Chest X- Ray IMPRESSION:  No active cardiopulmonary disease.  Patient updated via VM message.

## 2023-03-25 NOTE — Discharge Instructions (Signed)
 ECG has shows normal sinus rhythm.  And chest x-ray is negative.  Some acetaminophen or ibuprofen if you are having chest wall pain.  Follow-up if symptoms do not improve, worsen or new symptoms occur.

## 2023-12-02 ENCOUNTER — Other Ambulatory Visit: Payer: Self-pay | Admitting: Cardiology

## 2023-12-03 ENCOUNTER — Other Ambulatory Visit: Payer: Self-pay | Admitting: Obstetrics and Gynecology

## 2023-12-03 DIAGNOSIS — Z1231 Encounter for screening mammogram for malignant neoplasm of breast: Secondary | ICD-10-CM

## 2024-01-11 ENCOUNTER — Ambulatory Visit
Admission: RE | Admit: 2024-01-11 | Discharge: 2024-01-11 | Disposition: A | Source: Ambulatory Visit | Attending: Obstetrics and Gynecology | Admitting: Obstetrics and Gynecology

## 2024-01-11 DIAGNOSIS — Z1231 Encounter for screening mammogram for malignant neoplasm of breast: Secondary | ICD-10-CM

## 2024-01-16 NOTE — Progress Notes (Unsigned)
 "  Cardiology Office Note    Date:  01/17/2024  ID:  [name], [name] 31-Oct-1950, MRN [id number] PCP:  Benson Eleanor Rung, NP  Cardiologist:  Redell Leiter, MD  Electrophysiologist:  None   Chief Complaint: Follow up for CAD   History of Present Illness: .   Charlene Jones is a 73 y.o. female with visit-pertinent history of CAD, hypertension and hyperlipidemia.  In 07/2020 patient underwent cardiac catheterization that showed nonobstructive CAD.  In 06/2020 she underwent Lexiscan that showed a small reversible defect consistent with ischemia.  She underwent repeat cardiac catheterization that again showed nonobstructive CAD.  She is last seen in clinic by Delon Hoover, NP on 01/08/2023.  She had overall remained stable from a cardiac standpoint, did note some pedal edema.  Today she presents for follow-up.  She reports that she has been doing well overall.  She denies any chest pain, shortness of breath, orthopnea or PND.  She denies any palpitations, presyncope or syncope.  Patient notes occasional increased lower extremity edema that typically resolves overnight.  She notes that she has difficulty consistently taking her amlodipine , regularly forgets to take it as she is rushing in the mornings to go to work.  Patient notes that she previously was exercising regularly however in recent weeks has not as much given daylight savings time.  Labwork independently reviewed: 11/05/2023: Hemoglobin 14.9, hematocrit 43.3, sodium 139, Tessman 4.2, creatinine 0.61, AST 22, ALT 22 ROS: .   Today she denies chest pain, shortness of breath, fatigue, palpitations, melena, hematuria, hemoptysis, diaphoresis, weakness, presyncope, syncope, orthopnea, and PND.  All other systems are reviewed and otherwise negative. Studies Reviewed: SABRA   EKG:  EKG is ordered today, personally reviewed, demonstrating  EKG Interpretation Date/Time:  Monday January 17 2024 15:35:39 EST Ventricular Rate:   74 PR Interval:  170 QRS Duration:  76 QT Interval:  406 QTC Calculation: 450 R Axis:   -51  Text Interpretation: Normal sinus rhythm Left axis deviation When compared with ECG of 25-Mar-2023 18:06, T wave inversion no longer evident in Inferior leads Nonspecific T wave abnormality now evident in Anterior leads Confirmed by Marissia Blackham [id number]) on 01/17/2024 4:24:08 PM   CV Studies: Cardiac studies reviewed are outlined and summarized above. Otherwise please see EMR for full report. Cardiac Studies & Procedures   ______________________________________________________________________________________________ CARDIAC CATHETERIZATION  CARDIAC CATHETERIZATION 07/26/2020  Conclusion  2nd Diag lesion is 50% stenosed. Very small vessel.  Mid LAD lesion is 25% stenosed.  Mid RCA lesion is 10% stenosed.  The left ventricular systolic function is normal.  LV end diastolic pressure is normal.  The left ventricular ejection fraction is 55-65% by visual estimate.  There is no aortic valve stenosis.  Nonobstructive CAD. Continue medical therapy.  Right radial spasm.  It may be difficult to get a 6 Fr guide catheter through the radial if needed in the future.  Findings Coronary Findings Diagnostic  Dominance: Right  Left Anterior Descending Mid LAD lesion is 25% stenosed.  Second Diagonal Branch 2nd Diag lesion is 50% stenosed.  Right Coronary Artery Mid RCA lesion is 10% stenosed.  Intervention  No interventions have been documented.   STRESS TESTS  MYOCARDIAL PERFUSION IMAGING 07/17/2020  Interpretation Summary  The left ventricular ejection fraction is hyperdynamic (>65%).  Nuclear stress EF: 77%.  There was no ST segment deviation noted during stress.  No T wave inversion was noted during stress.  6 beat run of nonsustained ventricular tachycardia at peak exercise.  Exercise duration 8 minutes 16 seconds, achieving 92% of maximal predicted heart rate, at 10.1  METS. Blood pressure response was normal with exercise.  Defect 1: There is a small reversible defect of mild severity present in the basal inferior location.  Findings consistent with ischemia.  This is an intermediate risk study, due to ischemia as well as nonsustained ventricular tachycardia during peak exercise.            ______________________________________________________________________________________________       Current Reported Medications:.    Active Medications[1]  Physical Exam:    VS:  BP 132/78   Pulse 74   Ht 5' 5 (1.651 m)   Wt 142 lb 3.2 oz (64.5 kg)   SpO2 99%   BMI 23.66 kg/m    Wt Readings from Last 3 Encounters:  01/17/24 142 lb 3.2 oz (64.5 kg)  01/08/23 143 lb (64.9 kg)  11/28/21 148 lb 3.2 oz (67.2 kg)    GEN: Well nourished, well developed in no acute distress NECK: No JVD; No carotid bruits CARDIAC: RRR, no murmurs, rubs, gallops RESPIRATORY:  Clear to auscultation without rales, wheezing or rhonchi  ABDOMEN: Soft, non-tender, non-distended EXTREMITIES:  No edema; No acute deformity     Asessement and Plan:.    CAD: Patient with history of nonobstructive CAD per cardiac catheterization in 2022. Stable with no anginal symptoms. No indication for ischemic evaluation.  Heart healthy diet and regular cardiovascular exercise encouraged.   Continue aspirin  81 mg daily, amlodipine  2.5 mg nightly and Crestor  20 mg daily. Reviewed ED precautions.   HTN: Blood pressure today 132/78.  Reviewed patient's home blood pressure log with systolic blood pressures ranging from 127-151, patient reports that she takes her blood pressure prior to taking her medications although does occasionally forget to take her amlodipine .  Notes some intermittent lower extremity edema at the ankles.  Today appears euvolemic and well compensated.  Encouraged patient to start taking amlodipine  2.5 mg nightly and to monitor her blood pressure the following morning.  She will  keep blood pressure log and follow-up in 6 weeks.  HLD: Last lipid profile on 11/05/2023 indicated total cholesterol 173, triglycerides 53, HDL 69 and LDL 90.  Discussed with patient given history of CAD recommend LDL goal less than 70.  Patient agreeable to increasing Crestor  to 20 mg daily.  Check fasting lipid profile and LFTs on follow-up.   Disposition: F/u with Ladell Bey, NP in six weeks.   Signed, Joel Mericle D Arelene Moroni, NP       [1]  Current Meds  Medication Sig   aspirin  EC 81 MG tablet Take 1 tablet (81 mg total) by mouth daily. Swallow whole.   Cholecalciferol 50 MCG (2000 UT) CAPS Take 2,000 Units by mouth daily.   Multiple Vitamins-Minerals (MULTIVITAMIN WITH MINERALS) tablet Take 2 tablets by mouth daily.   nitroGLYCERIN  (NITROSTAT ) 0.4 MG SL tablet Place 1 tablet (0.4 mg total) under the tongue every 5 (five) minutes as needed for chest pain. Up to three times.   Omega-3 1000 MG CAPS Take 1,000 mg by mouth daily.   oseltamivir  (TAMIFLU ) 75 MG capsule Take 1 capsule (75 mg total) by mouth every 12 (twelve) hours.   [DISCONTINUED] amLODipine  (NORVASC ) 2.5 MG tablet Take 2.5 mg by mouth daily.   [DISCONTINUED] amLODipine  (NORVASC ) 5 MG tablet Take 0.5 tablets (2.5 mg total) by mouth daily.   [DISCONTINUED] rosuvastatin  (CRESTOR ) 10 MG tablet Take 1 tablet (10 mg total) by mouth daily.   "

## 2024-01-17 ENCOUNTER — Ambulatory Visit: Attending: Cardiology | Admitting: Cardiology

## 2024-01-17 ENCOUNTER — Encounter: Payer: Self-pay | Admitting: Cardiology

## 2024-01-17 VITALS — BP 132/78 | HR 74 | Ht 65.0 in | Wt 142.2 lb

## 2024-01-17 DIAGNOSIS — I10 Essential (primary) hypertension: Secondary | ICD-10-CM | POA: Diagnosis not present

## 2024-01-17 DIAGNOSIS — E785 Hyperlipidemia, unspecified: Secondary | ICD-10-CM | POA: Insufficient documentation

## 2024-01-17 DIAGNOSIS — I2510 Atherosclerotic heart disease of native coronary artery without angina pectoris: Secondary | ICD-10-CM | POA: Insufficient documentation

## 2024-01-17 MED ORDER — AMLODIPINE BESYLATE 5 MG PO TABS: 2.5000 mg | ORAL_TABLET | Freq: Every evening | ORAL | 3 refills | Status: DC

## 2024-01-17 MED ORDER — ROSUVASTATIN CALCIUM 20 MG PO TABS: 20.0000 mg | ORAL_TABLET | Freq: Every day | ORAL | 3 refills | Status: AC

## 2024-01-17 NOTE — Patient Instructions (Signed)
 Medication Instructions:  Take Amlodipine  at night  INCREASE: Crestor  to 20 mg daily *If you need a refill on your cardiac medications before your next appointment, please call your pharmacy*  Lab Work: NONE If you have labs (blood work) drawn today and your tests are completely normal, you will receive your results only by: MyChart Message (if you have MyChart) OR A paper copy in the mail If you have any lab test that is abnormal or we need to change your treatment, we will call you to review the results.  Testing/Procedures: NONE  Follow-Up: At [facility], you and your health needs are our priority.  As part of our continuing mission to provide you with exceptional heart care, our providers are all part of one team.  This team includes your primary Cardiologist (physician) and Advanced Practice Providers or APPs (Physician Assistants and Nurse Practitioners) who all work together to provide you with the care you need, when you need it.  Your next appointment:   6 week(s)  Provider:   Katlyn West, NP  We recommend signing up for the patient portal called MyChart.  Sign up information is provided on this After Visit Summary.  MyChart is used to connect with patients for Virtual Visits (Telemedicine).  Patients are able to view lab/test results, encounter notes, upcoming appointments, etc.  Non-urgent messages can be sent to your provider as well.   To learn more about what you can do with MyChart, go to [email or web address].   Other Instructions Check blood pressure 2 times daily until next appointment. Bring blood pressure log to the next appointment.

## 2024-02-02 ENCOUNTER — Ambulatory Visit: Admitting: Cardiology

## 2024-02-28 ENCOUNTER — Other Ambulatory Visit: Payer: Self-pay | Admitting: Cardiology

## 2024-03-20 ENCOUNTER — Ambulatory Visit: Admitting: Cardiology

## 2024-03-24 ENCOUNTER — Ambulatory Visit: Admitting: Cardiology
